# Patient Record
Sex: Male | Born: 2000 | Race: Black or African American | Hispanic: No | Marital: Single | Smoking: Never smoker
Health system: Western US, Academic
[De-identification: ages and names within clinical notes are randomized; demographics above are authoritative.]

## PROBLEM LIST (undated history)

## (undated) HISTORY — PX: INCISION AND DRAINAGE DEEP NECK ABSCESS: SHX1797

## (undated) MED ORDER — NITROGLYCERIN 0.2 MG/HR TD PT24
1.00 | MEDICATED_PATCH | Freq: Every day | TRANSDERMAL | 0 refills | Status: AC
Start: 2018-10-09 — End: ?

## (undated) MED ORDER — FAMOTIDINE 20 MG OR TABS
ORAL_TABLET | ORAL | 0 refills | Status: AC
Start: 2020-04-25 — End: ?

---
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
## 2020-02-14 ENCOUNTER — Ambulatory Visit (INDEPENDENT_AMBULATORY_CARE_PROVIDER_SITE_OTHER): Admitting: Sports Medicine

## 2020-02-18 ENCOUNTER — Encounter (INDEPENDENT_AMBULATORY_CARE_PROVIDER_SITE_OTHER): Payer: Self-pay | Admitting: Sports Medicine

## 2020-02-18 ENCOUNTER — Ambulatory Visit (INDEPENDENT_AMBULATORY_CARE_PROVIDER_SITE_OTHER): Admitting: Sports Medicine

## 2020-02-18 VITALS — BP 110/70 | HR 88 | Temp 98.9°F | Wt 189.0 lb

## 2020-02-18 NOTE — Progress Notes (Signed)
 [facility] and [facility]   Primary Care Sports Medicine    Chief Complaint:   Chief Complaint   Patient presents with   . Shoulder Pain, Right     Pain x 2-3 years. No Specific Injury. Patient plays football.   . Shoulder Pain, Left     History of Present Illness:: Dennis Morales is a 20 year old man who presents for Left > Right shoulder pain and instability s/p football season.    Left shoulder is worse with quick, overhead shoulder movements   - reaching for a ball -> feeks loose    - feels kind of numbness/tingling, but not quite, "like a carrot that's about to snap"  - worse of left shoulder, but noticing on right shoulder as well  - relieved by rest  - unable to sleep on left side with arm extended  - feeling shoulder instability   - onset: 2-3 years, but worse this year  - AC seperation on right in 7th grade (~2015), fracture in left shoulder in 9th grade (~2017) -> glenoid rim  - LHD (writes with left), but throws with right   - [facility] in [place], [place]    - last office visit (12/25/2019) left lower extremity seroma aspiration no issues since  - signing 03/19/2020, spring ball is plan    - 04/17/2019 XR left shoulder:   "Xray: Small non-displaced posterior glenoid linear fracture. Physes open. No other abnormalities identified."    - 05/06/2014 HPI ("Glenoid fracture of shoulder, left, with routine healing, subsequent encounter"):  20 yo M here with dad for L shoulder injury. On 04/13/14 the pt was playing non-contact FB - WR. Pt states he was running and the DB was in his space - he pushed him off with both hands and felt his L shoulder "do something weird." Denies subluxation or dislocation but had immediate pain. Stopped playing and did cryotx afterwards. Initially had pain with abduction and flexion. No previous L shoulder injury. Pt is LHD but throws with the R.    At last OV there was question of inferior labral fracture. Pt did one session of PT and all pain  resolved and now has no restrictions in ROM or strength. Feels ready to RTP.     There is no problem list on file for this patient.    History reviewed. No pertinent past medical history.  Past Surgical History:   Procedure Laterality Date   . INCISION AND DRAINAGE DEEP NECK ABSCESS Right     2 yoa     Current Outpatient Medications on File Prior to Visit   Medication Sig Dispense Refill   . doxycycline (MONODOX) 100 MG capsule Take 1 capsule (100 mg) by mouth 2 times daily. 14 capsule 0   . nitroGLYcerin (NITRO-DUR) 0.2 MG/HR patch Apply 1 patch topically daily. 30 patch 0     No current facility-administered medications on file prior to visit.     Allergies   Allergen Reactions   . Polytrim [Polymyxin B-Trimethoprim] Hives     Family History   Problem Relation Name Age of Onset   . No Known Heart Disease Mother     . No Known Heart Disease Father         Review of systems:  Interim Constitutional, Musculoskeletal, Integument, Neurologic, Gastrointestinal, Genitourinary changes from baseline other than mentioned above: no.  General - no fevers, chills, unintentional wt loss   Resp - no cough, shortness of breath  Neurologic - no weakness, tingling, problems walking, fainting   Vascular - no leg cramps with walking, blue or pale hands/digits   GI- no GI bleeding/vomiting/hematemesis/hematochezia  Card - no chest pain, irregular heart beat   Muscle - no muscle swelling or focal weakness; see above  Skin - no rashes, open wounds   Blood - no bleeding, bruising   Endo - no night sweats, no heat intolerance    Physical Exam:  BP 110/70   Pulse 88   Temp 98.9 F (37.2 C)   Wt 85.7 kg (189 lb)   SpO2 99%   GENERAL: NAD, comfortable, WDWN  RESP:  Unlabored breathing  PSYCH: normal affect  NEURO:  alert & oriented  SKIN: Intact, no rashes or erythema, no open wounds  LYMPH:  No lymphadenopathy  MSK:  Bilateral SHOULDER:    INSPECTION:  Anterior - axillary fold, sternoclavicular joint and AC joint within normal limits    Lateral - Deltoid normal   Posterior - C-spine with normal lordosis, spine of scapula and inferior angle of scapula normal     PALPATION:   Superior  No tenderness AC joint  No tenderness clavicle  Lateral  No tenderness acromion, greater tubercle  Anterior  No tenderness bicipital groove/Anterior shoulder  No tenderness proximal humerus  Posterior - C-spine non-ttp. Spine of scapula and inferior scapular angle non-tenderness to palpation  + tenderness to palpation posterior GH space on left shoulder    ROM:   neck with FROM, no TTP  Full ROM abduction (endorses weaker/unstable to 120* to 180* on left shoulder)  Full ROM flexion (endorses weaker/unstable to 145* to 180* on left shoulder)  Full ROM external rotation (posterior pain on left shoulder at terminal range of motion)  Full ROM internal rotation     STRENGTH TESTING:  4+/5 supraspinatous/abduction bilaterally   4/5 external rotation left, 4+/5 right  5/5 internal rotation  5/5 triceps  5/5 biceps        NEUROVASCULAR:   Sensation intact in the C4-C8 distribution     No vascular compromise noted/cap refill intact    SPECIAL TESTS:    AC Joint Evaluation:  - Negative Cross-Arm Test    Biceps tendon Evaluation:  - Negative Speeds     Glenohumeral Joint Evaluation:  - Pos anterior shift on left  - Pos posterior shift on left  - increased translation of left shoulder with inferior pull   - pos ABER apprehension on left shoulder    Labrum Evaluation:  -Pos Crank and Clunk Tests (left)  - pos deep pain, weakness, and click with O'Brien's (Active compression test) on left    Impingement Evaluation:  - equivocal Neer's/Hawkins (crepitus on bilateral shoulders)    Rotator Cuff Evaluation:  -Negative External Rotation Lag  -Negative Internal Rotation Lag  -Negative Jobe test    IMAGING:  The following images were independently reviewed by myself and discussed with patient  Associated Order(s):  X-RAY EXAM SHOULDER 2+ VIEW   Operation:  X-RAY EXAM OF SHOULDER 2 VIEWS    Pre-Op Diagnosis:  Chronic pain of both shoulders, Instability of left shoulder joint   Post-Op Diagnosis:  None.      Procedure Note:    Bilateral shoulder XR 3 view: No acute fracture, dislocation, or malalignment. Evidence of inferior glenoid rim fracture (left) with cortical opacity at location. Ossicle/loose body superior to right coracoid noted and separation of AC joint space consistent with patient's history noted; otherwise the joint spaces are well  preserved. Soft tissues are unremarkable.                 ASSESSMENT AND PLAN:  1. Chronic pain of both shoulders  - X-Ray Shoulder 2+ View    2. Instability of left shoulder joint  3. Internal derangement of left shoulder  4. Subluxation of left shoulder joint, initial encounter  Given clinical instability of left shoulder, likely internal (labrum vs capsule) pathology. Patient does not endorse subluxation or dislocation event, but sport (football) with repeat trauma. Weakness in left shuolder compared to right more concerning for unilateral pathology rather than systemic laxity process.  - MRI Shoulder WO    5. Loose body in right shoulder  6. Closed dislocation of right acromioclavicular joint, sequela  XR with ?loose body vs ossicle superior to coracoid and evidence of remote AC sprain. May explain patient's R shuolder symptoms.       Patient Instructions   Please call and schedule your imaging (left shoulder MRI)  [facility]: [phone]  Locations: [email or web address]     Calling to schedule your study will trigger your insurance to review the request. If they say no, please let us know right away so we can appeal that decision.  If they say yes, please confirm your financial responsibility before getting the study done.       Follow up: Return if symptoms worsen or fail to improve, for discussion of shuolder MRI. consider further (ultrasound) evaluation of right shouler (coracoid loose body vs ossicle?) at  next office visit.    Medication Management   Medications reviewed with patient and medication list reconciled.   Over the counter medications, herbal therapies, and supplements reviewed.   Patient's understanding and response to medications assessed.   Barriers to medications assessed and addressed.   Risks, benefits, alternatives to medications reviewed.     Barriers to Learning   Assessed: none.  Patient verbalizes understanding of teaching and instructions.  Diagnosis and treatment options were discussed in detail with the patient who is in agreement with the plan. All questions were answered.  RTC/ED precautions discussed with patient.    I spent ~30 minutes of total time with this patient today, including face-to-face time and non-face-to-face activities on today's date, not including separately reportable services/procedures.      Garner Gavel, M.D.  [facility] & [facility]  Monday 02/18/2020, 5:34 PM

## 2020-02-18 NOTE — Patient Instructions (Signed)
 Please call and schedule your imaging (left shoulder MRI)  [facility]: [phone]  Locations: [email or web address]     Calling to schedule your study will trigger your insurance to review the request. If they say no, please let us know right away so we can appeal that decision.  If they say yes, please confirm your financial responsibility before getting the study done.

## 2020-02-18 NOTE — Procedures (Signed)
 Bilateral shoulder XR 3 view: No acute fracture, dislocation, or malalignment. Evidence of inferior glenoid rim fracture (left) with cortical opacity at location. Ossicle/loose body superior to right coracoid noted and separation of AC joint space consistent with patient's history noted; otherwise the joint spaces are well preserved. Soft tissues are unremarkable.

## 2020-02-20 ENCOUNTER — Encounter (INDEPENDENT_AMBULATORY_CARE_PROVIDER_SITE_OTHER): Payer: Self-pay | Admitting: Sports Medicine

## 2020-02-26 ENCOUNTER — Telehealth (INDEPENDENT_AMBULATORY_CARE_PROVIDER_SITE_OTHER): Payer: Self-pay | Admitting: Sports Medicine

## 2020-02-26 NOTE — Telephone Encounter (Signed)
 Mom called very upset stating [facility] called to inform her pt will need to reschedule his MRI of his shoulder because the clinical notes were not sent over to insurance for authorization. Mom stated [facility] spoke with [facility] and she stated she was going to send a message to [facility] and Dr. Jace Martinet. Mom states now she wil need to pay out of pocket so that the patient can get the MRI done because he needs this completed in order to keep his scholarship for college. Mom is very "pissed off and ready to cancel everything with [facility] because something was not done". Mom advised Melissa will give [facility] a call directly

## 2020-02-26 NOTE — Telephone Encounter (Signed)
 Spoke with Mylinda Asa at [facility] and the MRI has been approved. I called the patients mother and informed her that the MRI has been approved for tomorrow.MA

## 2020-02-28 ENCOUNTER — Encounter (INDEPENDENT_AMBULATORY_CARE_PROVIDER_SITE_OTHER): Payer: Self-pay | Admitting: Sports Medicine

## 2020-02-28 ENCOUNTER — Telehealth: Admitting: Sports Medicine

## 2020-02-28 NOTE — Patient Instructions (Addendum)
 [facility]; Drs. Clarise Crooks (plus resident surgeons): phone: [phone]).    Please call [phone]) to schedule an appointment with an orthopedic sports medicine provider.    [address]  [facility] (lower level)  [place], [place]  [address]  Fax:  [phone]    Other orthopedic surgeons in the [facility] area:  1) Dr. Nolene Baumgarten: phone [phone]. [email or web address]  2 Dr. Lorean Rodes: phone [phone]. [email or web address]  3) [facility]: phone [phone].  [email or web address]

## 2020-02-28 NOTE — Progress Notes (Signed)
 ---------------------(data below generated by Luanna Cole, MD)--------------------    Patient Verification & Telemedicine Consent:    I am proceeding with this evaluation at the direct request of the patient.  I have verified this is the correct patient and have obtained verbal consent from the patient/ surrogate to perform this voluntary telemedicine evaluation (including obtaining history, performing examination and reviewing data provided by the patient).   The patient/ surrogate has the right to refuse this evaluation.  I have explained risks (including potential loss of confidentiality), benefits, alternatives, and the potential need for subsequent face to face care. Patient/ surrogate understands that there is a risk of medical inaccuracies given that our recommendations will be made based on reported data (and we must therefore assume this information is accurate).  Knowing that there is a risk that this information is not reported accurately, and that the telemedicine video, audio, or data feed may be incomplete, the patient agrees to proceed with evaluation and holds [place] harmless knowing these risks. In this evaluation, we will be providing recommendations only.  The ultimate decision to follow, or not follow, these recommendations will be left to the bedside treating/ requesting practitioner.  The patient/ surrogate has been notified that other healthcare professionals (including students, residents and Engineer, maintenance) may be involved in this audio-video evaluation.   All laws concerning confidentiality and patient access to medical records and copies of medical records apply to telemedicine. The patient/ surrogate has received the MyChart Notice of Privacy Practices.  I have reviewed this above verification and consent paragraph with the patient/ surrogate.  If the patient is not capacitated to understand the above, and no surrogate is available, since this is not an emergency evaluation,  the visit will be rescheduled until such time that the patient can consent, or the surrogate is available to consent.    Demographics:   Medical Record #: [id number]   Date: February 28, 2020   Patient Name: Lionardo Haze   DOB: 2000/11/01  Age: 20 year old  Sex: male  Location: Home address on file    Evaluator(s):   Malikah Lakey was evaluated by me today.    Clinic Location:  [facility] [place]  [facility] [place]  [address]  [address]  [place] [address]  [phone]    Telehealth Method: office phone call     Justification for Telehealth Service:  Due to the COVID-19 Pandemic and a declared state of public health emergency, this service is being conducted via Video visit.     [facility] & [facility] TeleVisit    SUBJECTIVE  Neri Samek is a 20 year old man who presents for   Chief Complaint   Patient presents with   . Results     L shoulder MRI review   Mother, Lenis Dickinson, on the call.    - discussion of L shoulder MRI  - moving down to [facility]  - plans to sign with college in February 2022 and start spring practice (football)    Review of systems:    See HPI. Otherwise, patient did not endorse other symptoms.     Medications:    Current Outpatient Medications on File Prior to Visit   Medication Sig Dispense Refill   . doxycycline (MONODOX) 100 MG capsule Take 1 capsule (100 mg) by mouth 2 times daily. 14 capsule 0   . nitroGLYcerin (NITRO-DUR) 0.2 MG/HR patch  Apply 1 patch topically daily. 30 patch 0     No current facility-administered medications on file prior to visit.       Medical History:    Patient Active Problem List   Diagnosis   . Eczema   . Injury of shoulder, left, initial encounter       Surgical History:    Past Surgical History:   Procedure Laterality Date   . INCISION AND DRAINAGE DEEP NECK ABSCESS Right     2 yoa       Social History:    Social History     Socioeconomic History   . Marital  status: Single     Spouse name: Not on file   . Number of children: Not on file   . Years of education: Not on file   . Highest education level: Not on file   Occupational History   . Not on file   Tobacco Use   . Smoking status: Never Smoker   . Smokeless tobacco: Never Used   Substance and Sexual Activity   . Alcohol use: No   . Drug use: No   . Sexual activity: Never   Other Topics Concern   . Military Service Not Asked   . Blood Transfusions Not Asked   . Caffeine Concern No   . Occupational Exposure Not Asked   . Hobby Hazards Not Asked   . Sleep Concern No     Comment: 5-6 hrs/night   . Stress Concern No     Comment: None   . Weight Concern Not Asked   . Special Diet No     Comment: Regular   . Back Care Not Asked   . Exercises Regularly Yes     Comment: Football, weight training x4-5 days/wk   . Bike Helmet Use Not Asked   . Seat Belt Use Not Asked   . Performs Self-Exams Not Asked   Social History Narrative    02/2019: school in [place] Plays football, running back.     Social Determinants of Health     Financial Resource Strain: Not on file   Food Insecurity: Not on file   Transportation Needs: Not on file   Physical Activity: Not on file   Stress: Not on file   Social Connections: Not on file   Intimate Partner Violence: Not on file   Housing Stability: Not on file       OBJECTIVE  There were no vitals taken for this visit.    General Assessment:  alert, speaking in full sentences and intelligibly without signs of distress  Lungs/Cardiopulmonary:  speaking in full sentences, no audible wheezes/respiratory sounds, breathing is comfortable and normal rate      IMAGING: report reviewed:  02/27/2020 Left shoulder MRI conclusions:  1) evidence of a prior posterior dislocation with a mild reverse anterior Hill-Sachs deformity and a reverse osseous Bankart injury noted throughout the posterior glenoid/labrum. Consider CT of the left shoulder for better osseous evaluation of the first osseous Bankart.  2) mild chondral  heterogeneity along the posterior aspect of the glenoid.   3) small glenohumeral joint effusion.    ASSESSMENT/PLAN  Seab Axel is a 20 year old male presenting with:    1. 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 Anterior Hill Sachs deformity, left  3. History of fracture in left glenoid rim (2016)  4. Subluxation of left shoulder joint, subsequent encounter  5. Instability of left shoulder joint  Reviewed imaging with patient and patient's mother over the phone. Given increasing clinical instability and MRI findings, referred to ortho.  - Sports Medicine (Orthopedics)    Follow up: Return if symptoms worsen or fail to improve.     Patient Instructions   [facility]; Drs. Suella Broad (plus resident surgeons): phone: [phone]).    Please call [phone]) to schedule an appointment with an orthopedic sports medicine provider.    [address]  [facility] ([address])  [place], [place]  [address]  Fax:  [phone]    Other orthopedic surgeons in the [facility] area:  1) Dr. Carmina Miller: phone [phone]. [email or web address]  2 Dr. Harlon Ditty: phone [phone]. [email or web address]  3) [facility]: phone [phone].  [email or web address]             Patient Care Management:  Plan discussed with pt including risks/benefits/alternatives including watchful waiting.  Informed pt of 24/7 on call MD.  ED if acutely worsening after hours.  Pt verbalized understanding.    Medications reviewed with patient and medication list reconciled.  Over the counter medications, herbal therapies and supplements reviewed.  Patient's understanding and response to medications assessed.    Barriers to medications assessed and addressed.  Risks, benefits, alternatives to medications reviewed.  F/U:  See instructions    Garner Gavel, M.D.  [facility]  & [facility]  02/28/20, 4:17 PM

## 2020-03-11 ENCOUNTER — Encounter (INDEPENDENT_AMBULATORY_CARE_PROVIDER_SITE_OTHER): Payer: Self-pay | Admitting: Sports Medicine

## 2020-03-11 ENCOUNTER — Ambulatory Visit (INDEPENDENT_AMBULATORY_CARE_PROVIDER_SITE_OTHER): Payer: BC Managed Care – PPO | Admitting: Sports Medicine

## 2020-03-11 VITALS — BP 117/46 | HR 81 | Temp 98.8°F

## 2020-03-11 DIAGNOSIS — M25312 Other instability, left shoulder: Secondary | ICD-10-CM

## 2020-03-11 NOTE — Progress Notes (Signed)
[place] DEPARTMENT OF ORTHOPAEDIC SURGERY, SPORTS MEDICINE    PRIMARY CARE PROVIDER:   Brock Ra      REASON FOR VISIT:  Left shoulder injury    HISTORY: Dennis Morales is a 20 year old male  who presents today for evaluation of left shoulder pain.  The patient is left hand dominant.  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  The 1st injury occurred 2-3 years ago.  The injury occurred while playing football.  While playing running back and blocking the opposing player he sustained what he describes as a posterior dislocation of his left shoulder.  Since that injury he is noting increasing episodes of left shoulder instability.  He notes some instability on the right but it is not as significant as the left.  He is playing high-level football and plans to play division 1 football next year.  He was 1st seen by his primary care physician who proceeded with radiographs as well as an MRI.  He presents to clinic today to review these findings and to potentially discuss surgical management.    RELEVANT HISTORY AND RISK FACTORS: Negative    PHYSICAL EXAM:  VITALS: BP 117/46    Pulse 81    Temp 98.8 F (37.1 C)   GENERAL: A+Ox3. INAD. Well appearing and well groomed. Normal gait  MENTAL STATUS: Pleasant and cooperative. Alert and oriented x3 with normal mood and affect.  Vascular: Pulses are palpable 2+, capillary refills to digits are normal.  Skin: No wounds/lesions/ulcers. Skin warm & dry.  Respiratory: No respiratory distress    MUSCULOSKELETAL:    Shoulder: Left   Normal posture.  Shoulder: No swelling.  Range of Motion: Forward Flexion 180. External Rotation 60. IR L1. Contralateral shoulder: Full active and passive range of motion  Passive is symmetric to active.  Tenderness: No tenderness at the [facility] joint, biceps, anterior, posterior joint.  Strength: 5/5 Supraspinatus, 5/5 External Rotators, 5/5 Subscapularis  Biceps:  Negative Speeds.  Impingement Signs:  Normal.  Stability:  He has grossly positive posterior laxity on the left side.  He has 1+ posterior laxity on the right side.  Sensation intact to light touch in the bilateral upper extremities.  2+ radial pulses.    ==========    IMAGING FINDINGS: I independently reviewed and interpreted the patient's imaging today.  Xrays:  Radiographs that were reviewed within the patient's last clinic encounter with his primary care physician reveal good alignment and good bone density.  MRI:  The patient's noncontrast MRI that was performed at imaging healthcare specialist was reviewed today in clinic.  He has extensive tearing of the posterior labrum.  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  His articular cartilage is intact.  He has a small anterior reverse Hill-Sachs.  The anterior labrum is intact.  The rotator cuff is intact.    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  He is currently looking at colleges.  He has had instability in the left shoulder for the last 2-3 years.  He has been able to work around this but recognizes that this is an ongoing problem that is not going away despite conservative care.  We discussed the importance bone loss on the MRI scan.  I would  like to get a scan to further evaluate this as bone grafting could be considered in his case.  He is in a bit of a difficult position as he carries the ball with his right arm and uses the left arm for blocking.  I had some discussion the patient and his mother regarding with the surgery would look like and how long recovery would lost.  We are going to hold off on making final decisions until we have his CT scan.  He understands and all questions were answered.    -Patient was educated on clinical and imaging findings.  -Patient verbalizes understanding  of all discussions today, and all questions were answered satisfactorily.    I saw the patient, performed a physical exam, and developed the treatment plan.    I edited the note and agree with the content as written.    See PA/resident note for full detail.      ===========================    No past medical history on file.  Past Surgical History:   Procedure Laterality Date    INCISION AND DRAINAGE DEEP NECK ABSCESS Right     2 yoa     Current Outpatient Medications   Medication Sig    doxycycline (MONODOX) 100 MG capsule Take 1 capsule (100 mg) by mouth 2 times daily.    nitroGLYcerin (NITRO-DUR) 0.2 MG/HR patch Apply 1 patch topically daily.     No current facility-administered medications for this visit.      reports that he has never smoked. He has never used smokeless tobacco. He reports that he does not drink alcohol and does not use drugs.  family history includes No Known Heart Disease in his father and mother.  Allergies   Allergen Reactions    Polytrim [Polymyxin B-Trimethoprim] Hives

## 2020-03-11 NOTE — Patient Instructions (Signed)
Instructions For MRI or CT    Dr. Robertson has ordered a MRI or CT scan for you. Please call [facility] Scheduling at [phone] to schedule an appointment at your earliest convenience. Once you have scheduled the MRI/CT call the Sports Medicine office at [phone]) to schedule a follow up visit to review your results with Dr. Robertson. You do not have to wait until after the imaging has been done to schedule your follow up visit. It may take 2-3 business days for full results to be available after your imaging has been completed.     If you do not have your MRI/CT completed at [facility] you will need to bring a copy of the imaging and the report to the follow up visit.

## 2020-03-17 ENCOUNTER — Ambulatory Visit
Admission: RE | Admit: 2020-03-17 | Discharge: 2020-03-17 | Disposition: A | Discharge status: Home / Self Care | Payer: BC Managed Care – PPO | Attending: Sports Medicine | Admitting: Sports Medicine

## 2020-03-17 DIAGNOSIS — Z01818 Encounter for other preprocedural examination: Secondary | ICD-10-CM

## 2020-03-17 DIAGNOSIS — M25312 Other instability, left shoulder: Secondary | ICD-10-CM | POA: Insufficient documentation

## 2020-03-21 ENCOUNTER — Ambulatory Visit (INDEPENDENT_AMBULATORY_CARE_PROVIDER_SITE_OTHER): Payer: BC Managed Care – PPO | Admitting: Sports Medicine

## 2020-03-21 DIAGNOSIS — S4992XA Unspecified injury of left shoulder and upper arm, initial encounter: Secondary | ICD-10-CM

## 2020-03-21 NOTE — Patient Instructions (Signed)
Sports Medicine Surgery Scheduling with Dr. Catherine Robertson    Please call Jake Buland @ [phone] after a minimum of  3 business days to begin the surgery scheduling process. If your case is urgent, Dr. Robertson will communicate with the surgery coordinator to get your surgery scheduled as soon as possible.    Insurance: Scheduling your surgery generally requires authorization from your insurance (including PPOs). This may take 10 business days and in some cases longer. If you have PPO insurance plan and would like to have an estimated amount of the out of pocket expenses, please call [phone].    Preoperative and 1st postoperative appointments:  Appointments will be scheduled for you as part of your surgery scheduling process.    Disability Paperwork:   Please leave your disability request form filled out at the front desk, and pay the $10 fee for processing. Please note, we fulfill all SDI claims online after your date of surgery. You must go to [email or web address], and begin your disability claim. Once this portion of your claim is finished, please call [phone] to advise Dr. Robertson's assistant. The call center will ensure that her assistant receives your message. Please provide the call center with your SDI claim receipt number ("R Number") and your expected return to work date. Allow 7-10 days for our office to process the online forms.    Post-operative physical therapy: When you have a surgery date, please call to schedule your first post-operative physical therapy appointment. Most patients will see a therapist within the first week after surgery. This visit needs to be scheduled three weeks prior to surgery or as soon as possible. You are welcome to go to physical therapy at the location of your choice. If you are unsure where you want to go please call your insurance company for a list of providers you are authorized to see for physical therapy.     Paperwork: If you need work/school-related  paperwork or a DMV placard for after surgery, please contact the office @ [phone] prior to surgery.    Imaging: If you did NOT have your x-rays or MRI done at [facility], please bring your CD and radiology report on the day of surgery.    Medical Clearance: If you have been told that you will require medical clearance prior to surgery, please arrange a visit with your primary care physician for pre-operative medical clearance and have any records or medical clearance documentation faxed to the surgery scheduler. Once you have been formally cleared and all tests results have been received we can provide you with a date for surgery.

## 2020-03-24 ENCOUNTER — Encounter (INDEPENDENT_AMBULATORY_CARE_PROVIDER_SITE_OTHER): Payer: Self-pay | Admitting: Sports Medicine

## 2020-03-24 NOTE — Progress Notes (Signed)
[place] DEPARTMENT OF ORTHOPAEDIC SURGERY, SPORTS MEDICINE    PRIMARY CARE PROVIDER:   Brock Ra      REASON FOR VISIT:  Left shoulder injury    HISTORY: Dennis Morales is a 20 year old male  who presents today for evaluation of left shoulder pain.  The patient is left hand dominant.  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  The 1st injury occurred 2-3 years ago.  The injury occurred while playing football.  While playing running back and blocking the opposing player he sustained what he describes as a posterior dislocation of his left shoulder.  Since that injury he is noting increasing episodes of left shoulder instability.  He notes some instability on the right but it is not as significant as the left.  He is playing high-level football and plans to play division 1 football next year.  He was 1st seen by his primary care physician who proceeded with radiographs as well as an MRI.  He presents to clinic today to review these findings and to potentially discuss surgical management.    RELEVANT HISTORY AND RISK FACTORS: Negative    PHYSICAL EXAM:  VITALS: There were no vitals taken for this visit.  GENERAL: A+Ox3. INAD. Well appearing and well groomed. Normal gait  MENTAL STATUS: Pleasant and cooperative. Alert and oriented x3 with normal mood and affect.  Vascular: Pulses are palpable 2+, capillary refills to digits are normal.  Skin: No wounds/lesions/ulcers. Skin warm & dry.  Respiratory: No respiratory distress    MUSCULOSKELETAL:    Shoulder: Left   Normal posture.  Shoulder: No swelling.  Range of Motion: Forward Flexion 180. External Rotation 60. IR L1. Contralateral shoulder: Full active and passive range of motion  Passive is symmetric to active.  Tenderness: No tenderness at the [facility] joint, biceps, anterior, posterior joint.  Strength: 5/5 Supraspinatus, 5/5 External Rotators, 5/5 Subscapularis  Biceps:  Negative Speeds.  Impingement Signs:  Normal.  Stability:  He has grossly positive posterior laxity on the left side.  He has 1+ posterior laxity on the right side.  Sensation intact to light touch in the bilateral upper extremities.  2+ radial pulses.    ==========    IMAGING FINDINGS: I independently reviewed and interpreted the patient's imaging today.  Xrays:  Radiographs that were reviewed within the patient's last clinic encounter with his primary care physician reveal good alignment and good bone density.  MRI:  The patient's noncontrast MRI that was performed at imaging healthcare specialist was reviewed today in clinic.  He has extensive tearing of the posterior labrum.  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  His articular cartilage is intact.  He has a small anterior reverse Hill-Sachs.  The anterior labrum is intact.  The rotator cuff is intact.    CT of the shoulder shows posterior glenoid bone loss of 20%.  There is a medialized old fracture- possibly from the injury in 8th grade.    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  He is currently looking at colleges.  He has had instability in the left shoulder for the last 2-3 years.  He has been able to work around this but recognizes that this is an ongoing problem that is  not going away despite conservative care.      We went over the CT scan today with Dennis Morales and his mother.  We had a further discussion regarding operative versus nonoperative treatment. The patient  would like to proceed with operative treatment which would be Left shoulder arthroscopy with labral treatment and open bine graft placement to the posterior glenoid using iliac crest. We discussed the details of surgery as well as the rehabilitation today. We also discussed the risks of surgery including risk of anesthesia,  blood loss, infection, DVT, incomplete pain relief, no pain relief, stiffness, neurovascular injury, reinjury or retear, recurrent instability, need for hardware removal.. All questions were answered and surgery scheduling information was provided.      -Patient was educated on clinical and imaging findings.  -Patient verbalizes understanding of all discussions today, and all questions were answered satisfactorily.    ===========================    No past medical history on file.  Past Surgical History:   Procedure Laterality Date    INCISION AND DRAINAGE DEEP NECK ABSCESS Right     2 yoa     Current Outpatient Medications   Medication Sig    doxycycline (MONODOX) 100 MG capsule Take 1 capsule (100 mg) by mouth 2 times daily.    nitroGLYcerin (NITRO-DUR) 0.2 MG/HR patch Apply 1 patch topically daily.     No current facility-administered medications for this visit.      reports that he has never smoked. He has never used smokeless tobacco. He reports that he does not drink alcohol and does not use drugs.  family history includes No Known Heart Disease in his father and mother.  Allergies   Allergen Reactions    Polytrim [Polymyxin B-Trimethoprim] Hives

## 2020-03-25 ENCOUNTER — Encounter (INDEPENDENT_AMBULATORY_CARE_PROVIDER_SITE_OTHER): Payer: Self-pay

## 2020-03-25 ENCOUNTER — Encounter (INDEPENDENT_AMBULATORY_CARE_PROVIDER_SITE_OTHER): Payer: Self-pay | Admitting: Rehabilitative and Restorative Service Providers"

## 2020-03-25 DIAGNOSIS — Z1159 Encounter for screening for other viral diseases: Secondary | ICD-10-CM

## 2020-03-26 ENCOUNTER — Other Ambulatory Visit (INDEPENDENT_AMBULATORY_CARE_PROVIDER_SITE_OTHER): Payer: Self-pay | Admitting: Sports Medicine

## 2020-03-26 ENCOUNTER — Telehealth (HOSPITAL_BASED_OUTPATIENT_CLINIC_OR_DEPARTMENT_OTHER): Payer: Self-pay

## 2020-03-26 ENCOUNTER — Ambulatory Visit (INDEPENDENT_AMBULATORY_CARE_PROVIDER_SITE_OTHER): Payer: BC Managed Care – PPO | Admitting: Rehabilitative and Restorative Service Providers"

## 2020-03-26 ENCOUNTER — Encounter (INDEPENDENT_AMBULATORY_CARE_PROVIDER_SITE_OTHER): Payer: Self-pay | Admitting: Rehabilitative and Restorative Service Providers"

## 2020-03-26 ENCOUNTER — Encounter (HOSPITAL_COMMUNITY): Payer: Self-pay

## 2020-03-26 VITALS — BP 112/49 | HR 60 | Temp 97.6°F | Ht 68.0 in | Wt 192.0 lb

## 2020-03-26 DIAGNOSIS — M25312 Other instability, left shoulder: Secondary | ICD-10-CM

## 2020-03-26 DIAGNOSIS — G8918 Other acute postprocedural pain: Secondary | ICD-10-CM

## 2020-03-26 DIAGNOSIS — Z09 Encounter for follow-up examination after completed treatment for conditions other than malignant neoplasm: Secondary | ICD-10-CM

## 2020-03-26 MED ORDER — ONDANSETRON 4 MG OR TBDP
4.0000 mg | ORAL_TABLET | Freq: Three times a day (TID) | ORAL | 0 refills | Status: DC | PRN
Start: 2020-03-26 — End: 2020-04-04

## 2020-03-26 MED ORDER — NAPROXEN 500 MG OR TABS
500.0000 mg | ORAL_TABLET | Freq: Two times a day (BID) | ORAL | 0 refills | Status: AC
Start: 2020-03-26 — End: ?

## 2020-03-26 MED ORDER — OXYCODONE-ACETAMINOPHEN 5-325 MG OR TABS
1.0000 | ORAL_TABLET | ORAL | 0 refills | Status: DC | PRN
Start: 2020-03-26 — End: 2021-08-14

## 2020-03-26 MED ORDER — DOCUSATE SODIUM 250 MG OR CAPS
250.0000 mg | ORAL_CAPSULE | Freq: Two times a day (BID) | ORAL | 0 refills | Status: AC
Start: 2020-03-26 — End: ?

## 2020-03-26 NOTE — Telephone Encounter (Signed)
Called and spoke to pt mother- scheduled eval and follow ups for Sports med

## 2020-03-26 NOTE — Progress Notes (Signed)
Preoperative History and Physical      Primary Care Physician [name] R    Surgical Attending Physician:   Dr. Lamont Dowdy    CC: Left shoulder instability    HPI:     This is a 20 year old male who presents to clinic today for their pre op H&P. The patient is scheduled for:  Left shoulder arthroscopy with labral treatment open fixation of glenoid fracture with iliac crest bone graft versus allograft on (03/31/20)    Family/personal history anesthesia issues: no  Family/personal history of clotting or bleeding disorder: no  Personal history of DM: no      PT location:  Patient  [employer]) and [place] (when here in [place] post op-message sent to [facility] PT)    Patient Active Problem List    Diagnosis Date Noted    Eczema 02/28/2020    Injury of shoulder, left, initial encounter 02/28/2020       No past medical history on file.    Past Surgical History:   Procedure Laterality Date    INCISION AND DRAINAGE DEEP NECK ABSCESS Right     2 yoa       Current Outpatient Medications   Medication Sig Dispense Refill    docusate sodium (COLACE) 250 MG capsule Take 1 capsule (250 mg) by mouth 2 times daily. 20 capsule 0    naproxen (NAPROSYN) 500 MG tablet Take 1 tablet (500 mg) by mouth 2 times daily (with meals). 40 tablet 0    ondansetron (ZOFRAN ODT) 4 MG disintegrating tablet Take 1 tablet (4 mg) on or under the tongue every 8 hours as needed for Nausea/Vomiting. 10 tablet 0    oxyCODONE-acetaminophen (PERCOCET) 5-325 MG tablet Take 1 tablet by mouth every 4 hours as needed for Severe Pain (Pain Score 7-10). 40 tablet 0     No current facility-administered medications for this visit.       Allergies   Allergen Reactions    Polytrim [Polymyxin B-Trimethoprim] Hives         Family History   Problem Relation Name Age of Onset    No Known Heart Disease Mother      No Known Heart Disease Father         Social History     Socioeconomic History    Marital status: Single     Spouse name: Not on file    Number of  children: Not on file    Years of education: Not on file    Highest education level: Not on file   Occupational History    Not on file   Tobacco Use    Smoking status: Never Smoker    Smokeless tobacco: Never Used   Substance and Sexual Activity    Alcohol use: No    Drug use: No    Sexual activity: Never   Other Topics Concern    Military Service Not Asked    Blood Transfusions Not Asked    Caffeine Concern No    Occupational Exposure Not Asked    Hobby Hazards Not Asked    Sleep Concern No     Comment: 5-6 hrs/night    Stress Concern No     Comment: None    Weight Concern Not Asked    Special Diet No     Comment: Regular    Back Care Not Asked    Exercises Regularly Yes     Comment: Football, weight training x4-5 days/wk    Bike  Helmet Use Not Asked    Seat Belt Use Not Asked    Performs Self-Exams Not Asked   Social History Narrative    02/2019: school in [place] Plays football, running back.     Social Determinants of Health     Financial Resource Strain: Not on file   Food Insecurity: Not on file   Transportation Needs: Not on file   Physical Activity: Not on file   Stress: Not on file   Social Connections: Not on file   Intimate Partner Violence: Not on file   Housing Stability: Not on file     Review of Systems:  GENERAL: No recent cold or flu, no fever, no wt. loss  EYES: No acuity change, diplopia or blurry vision.  ENT: Denies hearing loss, nasal drainage, sore throat.  CV:  Denies chest pain, palpitations, PND, orthopnea.    RESP: Denies SOB, cough, wheezing, sputum   GI: Denies nausea, vomiting, diarrhea, constipation, melena, hematochezia,  or GERD/heartburn.  GU: Denies dysuria, urgency, hematuria,  frequency, nocturia.  MUSCULOSKELETAL:  Negative back pain, neck pain.  SKIN:  Denies lesions, rashes, masses.  NEURO: Denies syncope, dizziness, headaches, seizures, weakness, numbness.  PSYCH: Denies  depression, anxiety, suicidal.  ENDOCRINE: Denies  polyuria, polydipsia, thyroid  problems.  HEME/LYMPH:  Denies  bruising, bleeding, anemia.   MRSA-VRE:  Denies    Physical Exam:   Vitals: BP 112/49    Pulse 60    Temp 97.6 F (36.4 C) (Temporal)    Ht 5\' 8"  (1.727 m)    Wt 87.1 kg (192 lb)    BMI 29.19 kg/m     Physical Exam  General: WN WD NAD, A+Ox3, perrla, speech clear  Cardiovascular: S1, S2  RRR,  with no murmurs, rubs, or gallops  Pulmonary: No respiratory distress or labored breathing  GU: deferred    Musculoskeletal:   Shoulder: Left   Normal posture.  Shoulder: No swelling.  Range of Motion: Forward Flexion 180. External Rotation 60. IR L1. Contralateral shoulder: Full active and passive range of motion  Passive is symmetric to active.  Tenderness: No tenderness at the [facility] joint, biceps, anterior, posterior joint.  Strength: 5/5 Supraspinatus, 5/5 External Rotators, 5/5 Subscapularis  Biceps:  Negative Speeds.  Impingement Signs: Normal.  Stability:  He has grossly positive posterior laxity on the left side.  He has 1+ posterior laxity on the right side.  Sensation intact to light touch in the bilateral upper extremities.  2+ radial pulses.      Imaging:   Xrays:  Radiographs that were reviewed within the patient's last clinic encounter with his primary care physician reveal good alignment and good bone density.  MRI:  The patient's noncontrast MRI that was performed at imaging healthcare specialist was reviewed today in clinic.  He has extensive tearing of the posterior labrum.  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  His articular cartilage is intact.  He has a small anterior reverse Hill-Sachs.  The anterior labrum is intact.  The rotator cuff is intact.    Assessment/Plan  Preoperative history and physical exam completed    This is a 20 year old male  with a history of left shoulder instability scheduled for Left shoulder arthroscopy with labral treatment open  fixation of glenoid fracture with iliac crest bone graft versus allograft (03/31/20) with Dr. 04/02/20. Surgical consent  was signed today in clinic. The patient was prescribed their post op medication to include Percocet, Naprosyn, Docusate and Zofran.    We had a further discussion regarding operative versus nonoperative treatment. The patient would like to proceed with operative treatment. We discussed the details of surgery as well as the rehabilitation today. We also discussed the risks of surgery including risk of anesthesia, blood loss, infection, DVT, incomplete pain relief, no pain relief, stiffness, neurovascular injury, reinjury or retear. All questions were answered.    A CURES report was run today if the patient received narcotic pain medication for greater than 5 days. The medications prescribed today were reviewed with Dr. Merilynn Finland and are in line with our usual prescribing practices for this procedure.    Instructions  1. Do not drink or eat anything after midnight, unless otherwise specified.   2. Take your medications as directed by anesthesia.   3. Do not take herbal supplements for 7 days prior to surgery.   4. Stop ASA, NSAID's 7-10 days prior to procedure.    Code Status: Full Code/Full Care

## 2020-03-28 ENCOUNTER — Other Ambulatory Visit (INDEPENDENT_AMBULATORY_CARE_PROVIDER_SITE_OTHER): Payer: BC Managed Care – PPO | Attending: Acute Care

## 2020-03-28 DIAGNOSIS — Z20822 Contact with and (suspected) exposure to covid-19: Secondary | ICD-10-CM | POA: Insufficient documentation

## 2020-03-28 DIAGNOSIS — Z1159 Encounter for screening for other viral diseases: Secondary | ICD-10-CM | POA: Insufficient documentation

## 2020-03-28 LAB — COVID-19 CORONAVIRUS DETECTION ASSAY AT ~~LOC~~ LAB: COVID-19 Coronavirus Result: NOT DETECTED

## 2020-03-31 ENCOUNTER — Ambulatory Visit (HOSPITAL_BASED_OUTPATIENT_CLINIC_OR_DEPARTMENT_OTHER): Payer: BC Managed Care – PPO

## 2020-03-31 ENCOUNTER — Encounter (HOSPITAL_BASED_OUTPATIENT_CLINIC_OR_DEPARTMENT_OTHER): Admission: RE | Disposition: A | Discharge status: Home Health | Payer: Self-pay | Attending: Sports Medicine

## 2020-03-31 ENCOUNTER — Encounter (HOSPITAL_BASED_OUTPATIENT_CLINIC_OR_DEPARTMENT_OTHER): Payer: Self-pay | Admitting: Sports Medicine

## 2020-03-31 ENCOUNTER — Other Ambulatory Visit (HOSPITAL_BASED_OUTPATIENT_CLINIC_OR_DEPARTMENT_OTHER): Payer: Self-pay | Admitting: Sports Medicine

## 2020-03-31 ENCOUNTER — Ambulatory Visit (HOSPITAL_BASED_OUTPATIENT_CLINIC_OR_DEPARTMENT_OTHER): Payer: BC Managed Care – PPO | Admitting: Student in an Organized Health Care Education/Training Program

## 2020-03-31 ENCOUNTER — Encounter (HOSPITAL_COMMUNITY): Payer: Self-pay

## 2020-03-31 ENCOUNTER — Other Ambulatory Visit (HOSPITAL_COMMUNITY): Payer: Self-pay

## 2020-03-31 ENCOUNTER — Ambulatory Visit
Admission: RE | Admit: 2020-03-31 | Discharge: 2020-03-31 | Disposition: A | Discharge status: Home / Self Care | Payer: BC Managed Care – PPO | Attending: Sports Medicine | Admitting: Sports Medicine

## 2020-03-31 DIAGNOSIS — Z419 Encounter for procedure for purposes other than remedying health state, unspecified: Secondary | ICD-10-CM

## 2020-03-31 DIAGNOSIS — S42142A Displaced fracture of glenoid cavity of scapula, left shoulder, initial encounter for closed fracture: Secondary | ICD-10-CM

## 2020-03-31 DIAGNOSIS — M25312 Other instability, left shoulder: Secondary | ICD-10-CM

## 2020-03-31 DIAGNOSIS — S43432A Superior glenoid labrum lesion of left shoulder, initial encounter: Secondary | ICD-10-CM

## 2020-03-31 DIAGNOSIS — S4292XP Fracture of left shoulder girdle, part unspecified, subsequent encounter for fracture with malunion: Secondary | ICD-10-CM | POA: Insufficient documentation

## 2020-03-31 DIAGNOSIS — S4992XA Unspecified injury of left shoulder and upper arm, initial encounter: Secondary | ICD-10-CM | POA: Insufficient documentation

## 2020-03-31 DIAGNOSIS — G8918 Other acute postprocedural pain: Secondary | ICD-10-CM

## 2020-03-31 DIAGNOSIS — Z888 Allergy status to other drugs, medicaments and biological substances status: Secondary | ICD-10-CM | POA: Insufficient documentation

## 2020-03-31 DIAGNOSIS — S42142P Displaced fracture of glenoid cavity of scapula, left shoulder, subsequent encounter for fracture with malunion: Secondary | ICD-10-CM

## 2020-03-31 DIAGNOSIS — S43492A Other sprain of left shoulder joint, initial encounter: Secondary | ICD-10-CM | POA: Insufficient documentation

## 2020-03-31 SURGERY — ARTHROSCOPY, SHOULDER
Anesthesia: General | Site: Shoulder | Laterality: Left | Wound class: Class I (Clean)

## 2020-03-31 MED ORDER — DIPHENHYDRAMINE HCL 50 MG/ML IJ SOLN
12.5000 mg | Freq: Once | INTRAMUSCULAR | Status: DC | PRN
Start: 2020-03-31 — End: 2020-03-31

## 2020-03-31 MED ORDER — LIDOCAINE HCL (PF) 1 % IJ SOLN
0.1000 mL | Freq: Once | INTRAMUSCULAR | Status: DC | PRN
Start: 2020-03-31 — End: 2020-03-31

## 2020-03-31 MED ORDER — ROPIVACAINE HCL 5 MG/ML IJ SOLN
INTRAMUSCULAR | Status: DC | PRN
Start: 2020-03-31 — End: 2020-03-31
  Administered 2020-03-31: 12 mL via PERINEURAL

## 2020-03-31 MED ORDER — PROPOFOL 200 MG/20ML IV EMUL
INTRAVENOUS | Status: AC
Start: 2020-03-31 — End: ?
  Filled 2020-03-31: qty 20

## 2020-03-31 MED ORDER — OXYCODONE HCL 5 MG OR TABS
5.0000 mg | ORAL_TABLET | Freq: Once | ORAL | Status: DC | PRN
Start: 2020-03-31 — End: 2020-03-31

## 2020-03-31 MED ORDER — FENTANYL CITRATE (PF) 50 MCG/ML IJ SOLN (WRAPPED RECORD) ~~LOC~~
50.0000 ug | INTRAMUSCULAR | Status: DC | PRN
Start: 2020-03-31 — End: 2020-03-31

## 2020-03-31 MED ORDER — MIDAZOLAM HCL 2 MG/2ML IJ SOLN
INTRAMUSCULAR | Status: DC | PRN
Start: 2020-03-31 — End: 2020-03-31
  Administered 2020-03-31: 2 mg via INTRAVENOUS

## 2020-03-31 MED ORDER — DEXAMETHASONE SODIUM PHOSPHATE 20 MG/5ML IJ SOLN
INTRAMUSCULAR | Status: AC
Start: 2020-03-31 — End: ?
  Filled 2020-03-31: qty 5

## 2020-03-31 MED ORDER — MIDAZOLAM HCL 2 MG/2ML IJ SOLN
INTRAMUSCULAR | Status: AC
Start: 2020-03-31 — End: ?
  Filled 2020-03-31: qty 2

## 2020-03-31 MED ORDER — ONDANSETRON HCL 4 MG/2ML IV SOLN
INTRAMUSCULAR | Status: DC | PRN
Start: 2020-03-31 — End: 2020-03-31
  Administered 2020-03-31: 4 mg via INTRAVENOUS

## 2020-03-31 MED ORDER — KETOROLAC TROMETHAMINE 30 MG/ML IJ SOLN
INTRAMUSCULAR | Status: AC
Start: 2020-03-31 — End: ?
  Filled 2020-03-31: qty 1

## 2020-03-31 MED ORDER — HYDROMORPHONE HCL 1 MG/ML IJ SOLN
INTRAMUSCULAR | Status: DC | PRN
Start: 2020-03-31 — End: 2020-03-31
  Administered 2020-03-31 (×3): .5 mg via INTRAVENOUS

## 2020-03-31 MED ORDER — FENTANYL CITRATE (PF) 50 MCG/ML IJ SOLN (WRAPPED RECORD) ~~LOC~~
25.0000 ug | INTRAMUSCULAR | Status: DC | PRN
Start: 2020-03-31 — End: 2020-03-31

## 2020-03-31 MED ORDER — KETOROLAC TROMETHAMINE 30 MG/ML IJ SOLN
INTRAMUSCULAR | Status: DC | PRN
Start: 2020-03-31 — End: 2020-03-31
  Administered 2020-03-31 (×2): 15 mg via INTRAVENOUS

## 2020-03-31 MED ORDER — LIDOCAINE HCL (PF) 2 % IJ SOLN
INTRAMUSCULAR | Status: AC
Start: 2020-03-31 — End: ?
  Filled 2020-03-31: qty 5

## 2020-03-31 MED ORDER — DEXAMETHASONE SODIUM PHOSPHATE 4 MG/ML IJ SOLN (CUSTOM)
INTRAMUSCULAR | Status: DC | PRN
Start: 2020-03-31 — End: 2020-03-31
  Administered 2020-03-31: 6 mg via INTRAVENOUS

## 2020-03-31 MED ORDER — LIDOCAINE INFUSION FOR PAIN
INTRAVENOUS | Status: DC | PRN
Start: 2020-03-31 — End: 2020-03-31
  Administered 2020-03-31: 30 mg via INTRAVENOUS

## 2020-03-31 MED ORDER — ONDANSETRON HCL 4 MG/2ML IV SOLN
4.0000 mg | Freq: Once | INTRAMUSCULAR | Status: DC | PRN
Start: 2020-03-31 — End: 2020-03-31

## 2020-03-31 MED ORDER — HYDROMORPHONE HCL 1 MG/ML IJ SOLN
INTRAMUSCULAR | Status: AC
Start: 2020-03-31 — End: ?
  Filled 2020-03-31: qty 0.5

## 2020-03-31 MED ORDER — MEPERIDINE HCL 25 MG/ML IJ SOLN
12.5000 mg | INTRAMUSCULAR | Status: DC | PRN
Start: 2020-03-31 — End: 2020-03-31

## 2020-03-31 MED ORDER — FENTANYL CITRATE (PF) 100 MCG/2ML IJ SOLN
INTRAMUSCULAR | Status: AC
Start: 2020-03-31 — End: ?
  Filled 2020-03-31: qty 2

## 2020-03-31 MED ORDER — LACTATED RINGERS IV SOLN
INTRAVENOUS | Status: DC
Start: 2020-03-31 — End: 2020-03-31

## 2020-03-31 MED ORDER — SODIUM CHLORIDE 0.9 % IR SOLN
Status: DC | PRN
Start: 2020-03-31 — End: 2020-03-31

## 2020-03-31 MED ORDER — SODIUM CHLORIDE 0.9 % IV SOLN
12.5000 mg | Freq: Four times a day (QID) | INTRAVENOUS | Status: DC | PRN
Start: 2020-03-31 — End: 2020-03-31

## 2020-03-31 MED ORDER — NALOXONE HCL 0.4 MG/ML IJ SOLN
0.1000 mg | INTRAMUSCULAR | Status: DC | PRN
Start: 2020-03-31 — End: 2020-03-31

## 2020-03-31 MED ORDER — LACTATED RINGERS IV SOLN
INTRAVENOUS | Status: DC | PRN
Start: 2020-03-31 — End: 2020-03-31

## 2020-03-31 MED ORDER — PROPOFOL 200 MG/20ML IV EMUL
INTRAVENOUS | Status: DC | PRN
Start: 2020-03-31 — End: 2020-03-31
  Administered 2020-03-31: 200 mg via INTRAVENOUS

## 2020-03-31 MED ORDER — MENTHOL 3 MG MT LOZG
1.0000 | LOZENGE | OROMUCOSAL | Status: DC | PRN
Start: 2020-03-31 — End: 2020-03-31

## 2020-03-31 MED ORDER — BUPIVACAINE-EPINEPHRINE (PF) 0.25% -1:200000 IJ SOLN
INTRAMUSCULAR | Status: DC | PRN
Start: 2020-03-31 — End: 2020-03-31
  Administered 2020-03-31: 20 mL
  Administered 2020-03-31: 30 mL

## 2020-03-31 MED ORDER — ACETAMINOPHEN 325 MG PO TABS
975.0000 mg | ORAL_TABLET | Freq: Once | ORAL | Status: AC
Start: 2020-03-31 — End: 2020-03-31
  Administered 2020-03-31: 975 mg via ORAL
  Filled 2020-03-31: qty 3

## 2020-03-31 MED ORDER — CEFAZOLIN SODIUM 1 GM IJ SOLR
INTRAMUSCULAR | Status: DC | PRN
Start: 2020-03-31 — End: 2020-03-31
  Administered 2020-03-31 (×2): 2000 mg via INTRAVENOUS

## 2020-03-31 MED ORDER — CEFAZOLIN SODIUM 1 GM IJ SOLR
INTRAMUSCULAR | Status: AC
Start: 2020-03-31 — End: ?
  Filled 2020-03-31: qty 2000

## 2020-03-31 MED ORDER — HYDROMORPHONE HCL 1 MG/ML IJ SOLN
0.5000 mg | INTRAMUSCULAR | Status: DC | PRN
Start: 2020-03-31 — End: 2020-03-31

## 2020-03-31 MED ORDER — AMISULPRIDE (ANTIEMETIC) 10 MG/4ML IV SOLN
10.0000 mg | Freq: Once | INTRAVENOUS | Status: DC
Start: 2020-03-31 — End: 2020-03-31

## 2020-03-31 MED ORDER — FENTANYL CITRATE (PF) 250 MCG/5ML IJ SOLN
INTRAMUSCULAR | Status: DC | PRN
Start: 2020-03-31 — End: 2020-03-31
  Administered 2020-03-31 (×9): 50 ug via INTRAVENOUS

## 2020-03-31 MED ORDER — ONDANSETRON HCL 4 MG/2ML IV SOLN
INTRAMUSCULAR | Status: AC
Start: 2020-03-31 — End: ?
  Filled 2020-03-31: qty 2

## 2020-03-31 SURGICAL SUPPLY — 53 items
3M STERI-DRAPE 1000 (Drapes/towels) ×2 IMPLANT
ADAPTER DUAL SPIKE FOR SIMPULSE SUCTION/IRRIGATOR (Tubing/Suction) ×4
APPLICATOR CHLORAPREP 26ML, ~~LOC~~ (Prep Solutions) ×4
BLADE ARTHROSCOPY FORMULA 4.0MM RESECTOR (Knives/Blades) ×2
BLADE LRG MED AGRESS (Knives/Blades) ×2
BLADE SURGEON #15 STERILE (Knives/Blades) ×2 IMPLANT
BUR ROUND CUTTING 4.8MM (Drills/Bits/Burs/Taps/Reamers) ×2 IMPLANT
CANNULA ARTHROSCOPIC UNIVERSAL 5.5 X 70MM W/O FENETRATIONS (Lap/Endo/Arthroscopy) ×2
CANNULA TWIST IN 6 MM X 7 CM BX/5 (Needles/punch/cannula/biopsy) ×2
CAUTERY BOVIE ROCKER SWITCH PENCIL (Cautery) ×2
CAUTERY TIP EDGE BLADE ELECTRODE 2.5", HEX LOCKING (Cautery) ×2
DERMABOND ADVANCE 0.7ML (Suture) ×4
DRAPE ORTHO BAR SHEET 100 X 60" (Drapes/towels) ×2 IMPLANT
DRAPE U IMPERVIOUS SPLIT FILM, 54" X 76" (Drapes/towels) ×4
DRAPE X-RAY C ARM 41" X 74" (Drapes/towels) ×2
DRESSING TEGADERM HP 4X4 (Dressings/packing) ×10 IMPLANT
DRESSING XEROFORM STRL 1X8 (Dressings/packing) ×2
DRILL CANNULATED 2.75MM .066 (Drills/Bits/Burs/Taps/Reamers) ×2 IMPLANT
DRILL NON-CANNULATED 4MM (Drills/Bits/Burs/Taps/Reamers) ×2
GLOVE BIOGEL INDICATOR UNDERGLOVE SIZE 6.5 (Gloves/Gowns) ×2
GLOVE BIOGEL INDICATOR UNDERGLOVE SIZE 7 (Gloves/Gowns) ×2 IMPLANT
GLOVE BIOGEL PI INDICATOR SIZE 8 (Gloves/Gowns) ×2
GLOVE BIOGEL PI ULTRATOUCH SIZE 7.5 (Gloves/Gowns) ×2 IMPLANT
GLOVE SURGEON BIOGEL SIZE 7.5 (Gloves/Gowns) ×2 IMPLANT
GLOVE SURGICAL BIOGEL SIZE 6.5 (Gloves/Gowns) ×4
GOWN SIRUS XLG BLUE, AAMI LVL 4 (Gloves/Gowns) ×2
GUIDE WIRE .062 X 12"" LONG (Misc Medical Supply) ×4 IMPLANT
GUIDEWIRE ORTHOPEDIC W/TROCAR TIP .062" X 7" (Orthopedic guidewires) ×4
KIT JUGGERKNOT CURVED DISPOSABLE 1.5MM (Kits/Sets/Trays) ×2
KIT JUGGERKNOT STRAIGHT DISPOSABLE 1.5MM (Kits/Sets/Trays) IMPLANT
MAT SUCTION LOW PROFILE 50"" X 34"" (Misc Medical Supply) ×2
NEEDLE MAYO #4 0.05" X 1.26", 1/2 TAPER, CATGUT (Needles/punch/cannula/biopsy) ×2
SCREW CANNULATED FULLY THREADED 3.75MM X 38MM (Screws/anchors/cables) ×4 IMPLANT
SKIN PREP -BATH CLOTH CHG 2% (Prep Solutions) ×2 IMPLANT
SLING ULTRA III LG (Misc Medical Supply) ×2
SOFT ANCHOR JUGGERKNOT 1.5MM (Screws/anchors/cables) ×2 IMPLANT
SOLUTION IRR BAG .9% N/S 3000ML (Non-Pharmacy Meds/Solutions) ×4 IMPLANT
SOLUTION IRR POUR BTL 0.9% NS 1000ML (Non-Pharmacy Meds/Solutions) ×2
SOLUTION IRR POUR BTL H20 1000ML (Non-Pharmacy Meds/Solutions) ×2
SPONGE LAP RF DETECT 18" X 18" XRAY STERILE (Sponges) ×6
STRIP MEDI-STRIP SKIN CLOSURE 1/2 X 4" (Dressings/packing) ×1 IMPLANT
STRIP SKIN CLOSURE 1/2 X 4 (Dressings/packing) ×2
SUPPORT MCCONNELL ARM 12-401 (Misc Surgical Supply) ×2 IMPLANT
SURGICAL PACK SHOULDER ARTHRO SAME DAY (Procedure Packs/kits) ×2
SUTURE ETHIBOND EXCEL 1 30" CT-1 (Suture) ×2 IMPLANT
SUTURE MONOCRYL 2-0 27" PS-2 (D8786) (Suture) ×4
SUTURE MONOCRYL 4-0 18" PS-2 (Y496) (Suture) ×2 IMPLANT
SUTURE MONOCRYL PLUS 3-0 27" PS-2 (MCP427) (Suture) ×4
SUTURE VICRYL+ 0 27" CP-2 (VCP870) (Suture) ×6
SYRINGE IRRG 50CC BULB (Misc Medical Supply) ×2 IMPLANT
TOWELS OR BLUE 4-PACK STERILE, DISPOSABLE (Drapes/towels) ×2 IMPLANT
TUBE YANKAUER FLEXIBLE TIP (Tubing/Suction) ×2 IMPLANT
TUBING CROSSFLOW INFLOW CASSETTE (Tubing/Suction) ×2 IMPLANT

## 2020-03-31 NOTE — Plan of Care (Signed)
Problem: Promotion of Perioperative Health and Safety  Goal: Promotion of Health and Safety of the Perioperative Patient  Description: The patient remains safe, receives treatment appropriate to the surgical intervention and patient's physiological needs and is discharged or transferred to the appropriate level of care.    Information below is the current care plan.  Outcome: Progressing

## 2020-03-31 NOTE — Anesthesia Procedure Notes (Signed)
Regional Anesthesia Block Procedure Note  Date & Time: 03/31/2020 12:52 PM    Short Procedural Summary (Full description of each separate nerve block  below):  Procedure #1 -  Interscalene.    Laterality - Left.    Block type - Single injection(s).                          Medications Administrations:  Medications Administered at:  03/31/2020 12:52 PM    Indications  Procedure performed to address pain in the Shoulder.  Laterality Left  Patient seen and examined in the PreOp area.  This procedure was performed at the request of the referring physician for post operative pain control.         Universal protocol  Verbal consent obtained, written consent obtained, risks and benefits discussed, patient states understanding of the procedure being performed, the patient's understanding of the procedure matches consent given, procedure consent matches procedure scheduled, relevant documents present and verified, test results available and properly labeled, site marked, imaging studies available, required blood products, implants, devices, and special equipment available and Immediately prior to procedure a time out was called to verify the correct patient, procedure, equipment, support staff and site/side marked as required  patient  verbally with patient, arm band, provided demographic data and hospital-assigned identification number  H&P: H&P Reviewed  Vital signs monitored: Yes  Sterile skin prep: Choloraprep (Chorahexadine)  O2 administered: Face Mask    Patient Instructions        Block Procedure #1  Block procedure type: Interscalene  Procedure laterality: Left    Nerve block:  Single injection(s)    Needle gauge used to perform procedure: Touhy and 22g  Block technique: Ultrasound guided  Ultrasound guidance used to identify relevant anatomy.  Ultrasound guidance used to visualize local anesthetic spread around nerve(s).  Ultrasound guidance used to guide needle to targeted nerve and avoid vascular puncture.    Brief  medication summary  IV sedation as documented on MAR. See MAR for full medication details  Local anesthetic medication: Ropivacaine    Local anesthetic volume: 12 mL  Complications: None  Procedure #1 comments: I, Peyton Bottoms, MD, was present for the entire procedure.    Peyton Bottoms, MD  03/31/2020 2:48 PM  Patient seen and examined in pre-op area, discussed risks, benefits, and alternatives of block procedure including but not limited to bleeding/infection, vascular injury, nerve damage, local anesthetic toxicity, and injury to nearby structures.  More specifically, the risk of nerve injury/damage was discussed in detail as this is a well recognized complication of nerve blocks (as well as a complication of surgery itself i.e. surgical trauma, tourniquet use, positioning, postoperative movement/manipulation, etc.).  Patient elected to proceed with block after understanding and accepting risks. Answered all questions, informed consent obtained. Patient tolerated procedure well without complications.         Block Procedure #2    Block Procedure #3    Block Procedure #4    Block Procedure #5    Block Procedure #6

## 2020-03-31 NOTE — Plan of Care (Signed)
Problem: Promotion of Perioperative Health and Safety  Goal: Promotion of Health and Safety of the Perioperative Patient  Description: The patient remains safe, receives treatment appropriate to the surgical intervention and patient's physiological needs and is discharged or transferred to the appropriate level of care.    Information below is the current care plan.  03/31/2020 1652 by Desirae Mancusi, RN  Outcome: Progressing  Flowsheets (Taken 03/31/2020 1652)  Guidelines: PACU  Individualized Interventions/Recommendations #1: reposition and medicate for pain goal  03/31/2020 1652 by Dublin Cantero, RN  Outcome: Progressing

## 2020-03-31 NOTE — Anesthesia Postprocedure Evaluation (Signed)
Anesthesia Post Note    Patient: Dennis Morales    Procedure(s) Performed: Procedure(s):  Left shoulder arthroscopy with labral treatment, open fixation of posterior glenoid fracture with iliac crest bone autograft      Final anesthesia type: General    Patient location: PACU    Post anesthesia pain: adequate analgesia    Mental status: awake, alert  and oriented    Airway Patent: Yes    Last Vitals:   Vitals Value Taken Time   BP 111/46 03/31/20 1730   Temp 36.2 03/31/20 1731   Pulse 90 03/31/20 1731   Resp 14 03/31/20 1731   SpO2 99 % 03/31/20 1731   Vitals shown include unvalidated device data.     Post vital signs: stable    Hydration: adequate    N/V:no    Anesthetic complications: no       Plan of care per primary team.

## 2020-03-31 NOTE — Discharge Instructions (Signed)
Dennis Morales. Merilynn Finland, MD  Department of Orthopaedic Surgery, Sports Medicine  [facility], [place]:   [phone]  [place] [address] Office, [place]: [phone]    Post-Operative Instructions  Shoulder Arthroscopy- Labral Repair/Capsulorrhaphy, open posterior shoulder stabilization     BANDAGES: You may remove the outer dressing and gauze pads after 7-10 days. You may cover the sutures loosely with a Band-Aid. Keep your sutures clean and dry.   SHOWER: If you wish to shower, you can do so on post op day # 2-3. Keep surgical dressing in place while you shower. If the dressing gets moist then remove the dressing after the shower and dry the area well before placing Band-Aids on the incisions. For subsequent showers use a plastic bag or saran wrap to keep your shoulder dry. Your sutures will be removed approximately 10-14 days after surgery.   SLING: The sling is to be worn AT ALL TIMES for 4-6 weeks EXCEPT to shower, get dressed and when in Physical Therapy. Do not try to actively lift your arm- this can cause strain on your repair.  You CAN move your elbow, wrist, & hand.   ICE: Ice will be placed on your shoulder in the recovery room. Leave it on as much as possible for the first 3 days. After 3 days, use it for a minimum of 4 times a day for 30 minutes. The more you use it, the less swelling and inflammation you will have.  Do not place ice directly on your skin: it can cause burns.  ACTIVITY: Rest the day of surgery. You may place a pillow under your forearm for comfort. It may be easier to sleep sitting up for the first few days.   DRIVING: No driving is allowed while you are wearing the sling.   RETURNING TO WORK OR SCHOOL: You may return to work (sedentary) or school 5-7 days after surgery if pain is tolerable. Returning to heavy labor will be determined by Dr. Merilynn Finland.  POST OPERATIVE VISIT: You should already have a post-operative visit scheduled.  If not call the office after surgery  to make an appointment 7-10 days after surgery.    PHYSICAL THERAPY: We will arrange for you to see a physical therapist for rehabilitation.  We will not start therapy until after your first post op appointment.  You will receive instructions to give to the physical therapist.  If you develop a fever (101.5), redness or drainage from the surgical incision site, please call our office to arrange for an evaluation.  PRESCRIPTIONS: You will receive prescriptions for medication to be used after surgery: these may include a narcotic pain medication, an anti-inflammatory, and/or an anti-nausea medication.  Narcotic Pain Medication: Percocet or Vicodin  Generally used for 1-2 weeks after surgery  May cause constipation and/or nausea  Anti-inflammatory Medication: Naprosyn or Motrin/Ibuprofen  If you have any difficulty using anti-inflammatory medications or aspirin, or have a history of ulcer disease, do not use.  You may use over the counter ibuprofen (Advil) or Naprosyn (Aleve) instead of the prescription we give you.  Anti-nausea Medication: Compazine or Zofran  If you have nausea after surgery, call the office so that we can provide appropriate medication.      Every patient is different: If Dr. Merilynn Finland gives you individual instructions (even if they are different from this handout), please follow them.  Phone: [phone]  **If you have any questions, please feel free to call our office.**

## 2020-03-31 NOTE — Plan of Care (Signed)
Problem: Promotion of Perioperative Health and Safety  Goal: Promotion of Health and Safety of the Perioperative Patient  Description: The patient remains safe, receives treatment appropriate to the surgical intervention and patient's physiological needs and is discharged or transferred to the appropriate level of care.    Information below is the current care plan.  03/31/2020 1652 by Danella Maiers, RN  Outcome: Progressing  Flowsheets (Taken 03/31/2020 1652)  Guidelines: PACU  Individualized Interventions/Recommendations #1: reposition and medicate for pain goal  03/31/2020 1652 by Danella Maiers, RN  Outcome: Progressing

## 2020-03-31 NOTE — Anesthesia Preprocedure Evaluation (Signed)
ANESTHESIA PRE-OPERATIVE EVALUATION    Patient Information    Name: Dennis Morales    MRN: [id number]    DOB: [birth year]    Age: 20 year old    Sex: male  Procedure(s):  Left shoulder arthroscopy with labral treatment open fixation of glenoid fracture with iliac crest bone graft versus allograft      Pre-op Vitals:   BP 129/61 (BP Location: Right arm, BP Patient Position: Semi-Fowlers)    Pulse 65    Temp 36.6 C    Resp 17    Ht 5' 9"  (1.753 m)    Wt 88.5 kg (195 lb)    SpO2 97%    BMI 28.80 kg/m    BMI kg/m2: 28.8 kg/m2    Primary language spoken:  English    ROS/Medical History:      History of Present Illness: Left shoulder arthroscopy with labral treatment open fixation of glenoid fracture with iliac crest bone graft versus allograft (Left Shoulder)    General:  negative for General ROS   Cardiovascular:  negative cardio ROS     Anesthesia History:  negative anesthesia history ROS   Pulmonary:   negative pulmonary ROS     Neuro/Psych:    Hematology/Oncology:       GI/Hepatic:  negative GI/hepatic ROS Infectious Disease:     Renal:   Endocrine/Other:     Pregnancy History:   Pediatrics:         Pre Anesthesia Testing (PCC/CPC) notes/comments:                 Physical Exam    Airway:    Inter-inciser distance > 4 cm  Prognanth Able    Mallampati: I  Neck ROM: full  TM distance: > 6 cm  Short thick neck: No          Cardiovascular:  - cardiovascular exam normal         Pulmonary:  - pulmonary exam normal           Neuro/Neck/Skeletal/Skin:      Dental:      Abdominal:              Last  OSA (STOP BANG) Score:  No data recorded    Last OSA  (STOP) Score for   Has a physician diagnosed you with sleep apnea?: No  Do you use a CPAP at home?: No  Do you snore loudly (loud enough to be heard through a closed door)?: 0  Do you often feel tired, fatigued or sleepy during the day?: 0  Has anyone observed that you stop breathing while you are sleeping?: 0  Have you ever been treated for high blood pressure?: 0  OSA total  score (A score of 2 or more is high risk. Offer patient sleep study.): 0        Has a physician diagnosed you with sleep apnea?: No  Do you use a CPAP at home?: No  OSA total score (A score of 2 or more is high risk. Offer patient sleep study.): 0    No past medical history on file.  Past Surgical History:   Procedure Laterality Date    INCISION AND DRAINAGE DEEP NECK ABSCESS Right     2 yoa     Social History     Tobacco Use    Smoking status: Never Smoker    Smokeless tobacco: Never Used   Substance Use Topics    Alcohol use: No  Drug use: No     Alcohol Use: Not on file       Current Facility-Administered Medications   Medication Dose Route Frequency Provider Last Rate Last Admin    lactated ringers infusion   IntraVENOUS Continuous Benito Mccreedy, MD 30 mL/hr at 03/31/20 1233 New Bag at 03/31/20 1233    lidocaine (XYLOCAINE) 1% PF injection 0.1 mL  0.1 mL IntraDERMAL Once PRN Benito Mccreedy, MD         Facility-Administered Medications Ordered in Other Encounters   Medication Dose Route Frequency Provider Last Rate Last Admin    ceFAZolin (ANCEF) injection   IntraVENOUS Intra-Op PRN Marily Lente, MD   2,000 mg at 03/31/20 1400    dexAMETHasone (DECADRON) injection   IntraVENOUS Intra-Op PRN Marily Lente, MD   6 mg at 03/31/20 1400    fentaNYL injection   IntraVENOUS Intra-Op PRN Marily Lente, MD   50 mcg at 03/31/20 1355    ketOROLAC (TORADOL) injection   IntraVENOUS Intra-Op PRN Marily Lente, MD   15 mg at 03/31/20 1400    lactated ringers infusion   IntraVENOUS Intra-Op Continuous Marily Lente, MD   New Bag at 03/31/20 1349    lidocaine 2000 mg/500 mL infusion for pain (4 mg/mL)   IntraVENOUS Intra-Op PRN Marily Lente, MD   30 mg at 03/31/20 1355    ondansetron [facility]) injection   IntraVENOUS Intra-Op PRN Marily Lente, MD   4 mg at 03/31/20 1400    propofol (DIPRIVAN) injection (200 mg/20 mL)    IntraVENOUS Intra-Op PRN Marily Lente, MD   200 mg at 03/31/20 1355     Allergies   Allergen Reactions    Polytrim [Polymyxin B-Trimethoprim] Hives       Labs and Other Data  No results found for: NA, SODIUM, K, CL, BICARB, BUN, CREAT, GLU, Lostant  No results found for: AST, ALT, GGT, LDH, ALK, TP, ALB, TBILI, DBILI  No results found for: WBC, RBC, HGB, HCT, MCV, MCHC, RDW, PLT, PLCTEL, MPV, MPVH, SEG, LYMPHS, MONOS, EOS, BASOS  No results found for: INR, PTT  No results found for: ARTPH, ARTPO2, ARTPCO2    Anesthesia Plan:  Risks and Benefits of Anesthesia  I have personally performed an appropriate pre-anesthesia physical exam of the patient (including heart, lungs, and airway) prior to the anesthetic and reviewed the pertinent medical history, drug and allergy history, laboratory and imaging studies and consultations.   I have determined that the patient has had adequate assessment and testing.  I have validated the documentation of these elements of the patient exam and/or have made necessary changes to reflect my own observations during my pre-anesthesia exam.  Anesthetic techniques, invasive monitors, anesthetic drugs for induction, maintenance and post-operative analgesia, risks and alternatives have been explained to the patient and/or patient's representatives.    I have prescribed the anesthetic plan:         Planned anesthesia method: General         ASA 2 (Mild systemic disease)     Potential anesthesia problems identified and risks including but not limited to the following were discussed with patient and/or patient's representative: Adverse or allergic drug reaction and Dental injury or sore throat    No Beta Blocker Indicated: Patient not on beta blockers    Planned monitoring method: Routine monitoring    Informed Consent:  Anesthetic plan and risks discussed with Patient.

## 2020-03-31 NOTE — H&P (Addendum)
HISTORY & PHYSICAL - INTERVAL ASSESSMENT    **ONLY TO BE USED IN ADDITION TO A HISTORY & PHYSICAL**    Dennis Morales  [id number]    This interval assessment is required for History & Physical completed less than 30 days but more than 24 hours prior to the admission or surgery. A History & Physical completed more than 30 days prior to the admission or surgery must be repeated. See 2/9 clinic note     Current Medical Status:  Unchanged    Medications / Allergies:  Unchanged    Review of Systems:  Unchanged     03/31/20  1216   BP: 130/65   Pulse: 78   Resp: 16   Temp: 97.9 F (36.6 C)   SpO2: 100%         Physical Examination:  Unchanged    Laboratory or Clinical Data:  Unchanged    Modifications of Initial Care Plan:  Unchanged    Will proceed with Left shoulder arthroscopy with labral treatment open fixation of glenoid fracture with iliac crest bone graft versus allograft on (03/31/20)    Bari Edward, MD  Resident Physician, Orthopaedic Surgery

## 2020-03-31 NOTE — Plan of Care (Signed)
Problem: Promotion of Perioperative Health and Safety  Goal: Promotion of Health and Safety of the Perioperative Patient  Description: The patient remains safe, receives treatment appropriate to the surgical intervention and patient's physiological needs and is discharged or transferred to the appropriate level of care.    Information below is the current care plan.  03/31/2020 1904 by Danella Maiers, RN  Outcome: Discharged  03/31/2020 1652 by Danella Maiers, RN  Outcome: Progressing  Flowsheets (Taken 03/31/2020 1652)  Guidelines: PACU  Individualized Interventions/Recommendations #1: reposition and medicate for pain goal  03/31/2020 1652 by Danella Maiers, RN  Outcome: Progressing

## 2020-04-01 ENCOUNTER — Telehealth (INDEPENDENT_AMBULATORY_CARE_PROVIDER_SITE_OTHER): Payer: Self-pay | Admitting: Sports Medicine

## 2020-04-01 NOTE — Addendum Note (Signed)
Addendum  created 04/01/20 1228 by Toy Cookey, DO    Clinical Note Signed

## 2020-04-01 NOTE — Anesthesia Follow Up (Signed)
Regional Anesthesia Telephone Encounter Follow-Up Note  Peripheral Nerve Block    History of Present Illness:     Dennis Morales is a 20 year old male who is POD 1 status post left shoulder arthroscopy. The patient had a left interscalene peripheral nerve block placed for post-operative pain/intraoperative anesthesia.     Regarding the pain, patient reports pain score: Controlled.    Patient reports:  Nerve block site is clean and dry without erythema, induration or tenderness to palpation  Sensory block: absent  Motor block: absent    Assessment:  This is a 20 year old male with post-operative/acute pain who had good pain control with regional nerve block.    Plan:   Block resolved  Patient advised to call [phone] and page the regional anesthesia fellow/resident on-call with any questions or concerns.    Note Author: Marcelino Duster Anna Livers, DO

## 2020-04-01 NOTE — Telephone Encounter (Signed)
Pt's mom is requesting an order for an ice machine. Please assist

## 2020-04-02 ENCOUNTER — Telehealth (INDEPENDENT_AMBULATORY_CARE_PROVIDER_SITE_OTHER): Payer: Self-pay | Admitting: Sports Medicine

## 2020-04-02 ENCOUNTER — Telehealth (HOSPITAL_BASED_OUTPATIENT_CLINIC_OR_DEPARTMENT_OTHER): Payer: Self-pay

## 2020-04-02 DIAGNOSIS — K5900 Constipation, unspecified: Secondary | ICD-10-CM

## 2020-04-02 NOTE — Telephone Encounter (Signed)
Shera Shaul PA-C is aware and attempting to contact patient over the phone.  So far unsuccessful in connecting but she suggests they go to the ED asap.

## 2020-04-02 NOTE — Telephone Encounter (Signed)
Returned call to patient's mom. Mother states that patient is now burping a lot and has a lot of gas. Son has not had a bowel movement. [facility] not [facility] the [facility] and mother did know it was prescribed. Mother instructed to try colace for bonstipation, simethicone for gas, small amount of blood was most likely from the strain of throwing up.    Mother instructed to try something bland like white rice, then a zofran 15-20 min after, then a pain pill 30-60 min after.     Patient verbalized understanding and denies any further questions or concerns at this time.     Instructed to call back [facility] with any further questions/concerns at [phone].    Sent to Provider.

## 2020-04-02 NOTE — Telephone Encounter (Signed)
Called back patient's mother (Alaina)re: ice machine.    Informed patient's mother that we have ice machine's available in clinic which are not covered by insurance, and would cost either $159 or $375, depending on the model selected.    The patient's mother stated she might have access to an ice machine already, but would call the clinic back if she decides to get one through [place].    If the patient's mother calls back to get an ice machine through our clinic, please direct the call to the Kaylyn Layer. @ [facility].

## 2020-04-02 NOTE — Telephone Encounter (Signed)
Spoke to [employer]. States that patient is having blood in emesis. "I saw it in the toilet." Reports that patient is lightheaded and very dizzy. C/O esophageal pain. He has no hx of esophagitis or Duodenal ulcers. He is in bed now alert and oriented.     I have asked that they hold the Naprosyn and Percocet. I have spoken to [facility]. They will contact provider.

## 2020-04-02 NOTE — Telephone Encounter (Signed)
Leodis Binet PA-C is aware and attempting to contact patient over the phone.  So far unsuccessful in connecting but she suggests they go to the ED asap.

## 2020-04-02 NOTE — Telephone Encounter (Signed)
Dennis Morales 20 year old   ESTABLISHED PAT ONLY     Caller's Name, Relationship to patient:    [name]      Reason for call (including the symptoms/concerns patient has):   Pt is experiencing pain in his groin area and under pad were his forearm is resting. Pt is nauseas and has vomiting.     Current Pain Level (0 Lowest -10 highest) :   8/10        Provider Name:  Dr. Merilynn Finland      If  Patient had surgery: (Procedure Name) (DOS)  03/31/2020  Left shoulder arthroscopy with labral treatment, open fixation of posterior glenoid fracture with iliac crest bone autograft      LOV Date and Plan:03/31/20    Modifications of Initial Care Plan:  Unchanged    Will proceed with Left shoulder arthroscopy with labral treatment open fixation of glenoid fracture with iliac crest bone graft versus allografton (03/31/20)          Best time to call patient and/or Medical Care Facility, and best contact ph# (RN please document outbound call):    [phone]        Reminder:  Symptom calls should be responded to within 2 hours by RN. If you need a more urgent callback to patient, please use RN chat group and/or page. Use messaging guidelines for red-flag symptoms. Please verify that your patient has received callback with-in the requested timeframe.

## 2020-04-02 NOTE — Telephone Encounter (Signed)
Pt's mom requesting to speak with RN, pt is now throwing up blood.

## 2020-04-02 NOTE — Addendum Note (Signed)
Addendum  created 04/02/20 0621 by Delontae Lamm    Charge Capture section accepted, SmartForm saved

## 2020-04-02 NOTE — Telephone Encounter (Signed)
DOS 03/31/20: Dr Merilynn Finland  Left shoulder arthroscopy with labral treatment, open fixation of posterior glenoid fracture with iliac crest bone autograft      Mother and patient report:   He is "very nauseated and has had emesis through the night."   He has not been taking the Zofran. I have advised to take every 8 hors for the next 24 hours at least. He will begin small quantities of food only after 30 min. He is taking Electrolyte drink/water-sips only.     His pain evea is 6. He took Naprosyn without food at 5am.   I discussed once he has Zofran, small quantity of food, then take Percocet.   Percocet and Naprosyn should be taken with food or they may cause nausea.     He is asking for his ice machine which he thought was ordered yesterday through Dr Charlean Sanfilippo office.     He states his sling has a piece on it that is heavy on his chest. He has not removed the sling. Will ask ATC to call for sling adjustment. CMST WNL to his left hand.     Groin area is "sore and stinging where he had the bone grafts. "

## 2020-04-02 NOTE — Telephone Encounter (Signed)
Spoke with patients mother regarding symptoms at 1220. She reports he is no longer vomiting. He had small amount of blood previously. He is tolerating liquids well and urinating appropriately. He is complaining of sharp abdominal pain. He has not had a BM since surgery. He will take Colace. Strict ED precautions given.

## 2020-04-03 MED ORDER — MAGNESIUM CITRATE 1.745 GM/30ML OR SOLN
100.0000 mL | Freq: Every day | ORAL | 1 refills | Status: AC
Start: 2020-04-03 — End: ?

## 2020-04-03 NOTE — Telephone Encounter (Signed)
Spoke with patient.  He is doing better and is passing gas but no BM.  Wrote for mag citrate.  Will check in tomorrow.

## 2020-04-04 ENCOUNTER — Emergency Department (HOSPITAL_COMMUNITY): Payer: BC Managed Care – PPO

## 2020-04-04 ENCOUNTER — Emergency Department
Admission: EM | Admit: 2020-04-04 | Discharge: 2020-04-04 | Disposition: A | Discharge status: Home / Self Care | Payer: BC Managed Care – PPO | Attending: Emergency Medicine | Admitting: Emergency Medicine

## 2020-04-04 DIAGNOSIS — R200 Anesthesia of skin: Secondary | ICD-10-CM | POA: Insufficient documentation

## 2020-04-04 DIAGNOSIS — Z881 Allergy status to other antibiotic agents status: Secondary | ICD-10-CM | POA: Insufficient documentation

## 2020-04-04 DIAGNOSIS — R1013 Epigastric pain: Secondary | ICD-10-CM | POA: Insufficient documentation

## 2020-04-04 DIAGNOSIS — Z0000 Encounter for general adult medical examination without abnormal findings: Secondary | ICD-10-CM

## 2020-04-04 DIAGNOSIS — R202 Paresthesia of skin: Secondary | ICD-10-CM | POA: Insufficient documentation

## 2020-04-04 DIAGNOSIS — K5900 Constipation, unspecified: Secondary | ICD-10-CM | POA: Insufficient documentation

## 2020-04-04 DIAGNOSIS — R112 Nausea with vomiting, unspecified: Secondary | ICD-10-CM

## 2020-04-04 LAB — CBC WITH DIFF, BLOOD
ANC-Automated: 4.7 10*3/uL (ref 1.6–7.0)
Abs Basophils: 0 10*3/uL (ref <0.1)
Abs Eosinophils: 0 10*3/uL (ref 0.0–0.5)
Abs Lymphs: 1.3 10*3/uL (ref 0.8–3.1)
Abs Monos: 0.5 10*3/uL (ref 0.2–0.8)
Basophils: 0 %
Eosinophils: 0 %
Hct: 38.8 % — ABNORMAL LOW (ref 40.0–50.0)
Hgb: 13.5 gm/dL — ABNORMAL LOW (ref 13.7–17.5)
Lymphocytes: 20 %
MCH: 30 pg (ref 26.0–32.0)
MCHC: 34.8 g/dL (ref 32.0–36.0)
MCV: 86.2 um3 (ref 79.0–95.0)
MPV: 9.5 fL (ref 9.4–12.4)
Monocytes: 7 %
Plt Count: 214 10*3/uL (ref 140–370)
RBC: 4.5 10*6/uL — ABNORMAL LOW (ref 4.60–6.10)
RDW: 12.4 % (ref 12.0–14.0)
Segs: 72 %
WBC: 6.5 10*3/uL (ref 4.0–10.0)

## 2020-04-04 LAB — COMPREHENSIVE METABOLIC PANEL, BLOOD
ALT (SGPT): 78 U/L — ABNORMAL HIGH (ref 0–41)
AST (SGOT): 85 U/L — ABNORMAL HIGH (ref 0–40)
Albumin: 4.5 g/dL (ref 3.5–5.2)
Alkaline Phos: 73 U/L (ref 40–129)
Anion Gap: 13 mmol/L (ref 7–15)
BUN: 13 mg/dL (ref 6–20)
Bicarbonate: 28 mmol/L (ref 22–29)
Bilirubin, Tot: 0.6 mg/dL (ref <1.2)
Calcium: 9.6 mg/dL (ref 8.5–10.6)
Chloride: 100 mmol/L (ref 98–107)
Creatinine: 0.75 mg/dL (ref 0.67–1.17)
GFR: >60 mL/min
Glucose: 105 mg/dL — ABNORMAL HIGH (ref 70–99)
Potassium: 4.2 mmol/L (ref 3.5–5.1)
Sodium: 141 mmol/L (ref 136–145)
Total Protein: 7.8 g/dL (ref 6.0–8.0)

## 2020-04-04 LAB — HCV ANTIBODY WITH REFLEX QUANT: Hepatitis C Ab: NONREACTIVE

## 2020-04-04 LAB — ACETAMINOPHEN, BLOOD: Acetaminophen: <5 ug/mL — ABNORMAL LOW (ref 10–30)

## 2020-04-04 LAB — HIV 1/2 ANTIBODY & P24 ANTIGEN ASSAY, BLOOD: HIV 1/2 Antibody & P24 Antigen Assay: NONREACTIVE

## 2020-04-04 LAB — LIPASE, BLOOD: Lipase: 23 U/L (ref 13–60)

## 2020-04-04 MED ORDER — ALUM & MAG HYDROXIDE-SIMETH 200-200-20 MG/5ML OR SUSP
30.0000 mL | Freq: Once | ORAL | Status: AC
Start: 2020-04-04 — End: 2020-04-04
  Administered 2020-04-04: 30 mL via ORAL
  Filled 2020-04-04: qty 30

## 2020-04-04 MED ORDER — FAMOTIDINE 20 MG OR TABS
20.0000 mg | ORAL_TABLET | Freq: Two times a day (BID) | ORAL | 0 refills | Status: AC
Start: 2020-04-04 — End: ?

## 2020-04-04 MED ORDER — LIDOCAINE VISCOUS 2 % MT SOLN
10.0000 mL | Freq: Once | OROMUCOSAL | Status: AC
Start: 2020-04-04 — End: 2020-04-04
  Administered 2020-04-04: 10 mL via ORAL
  Filled 2020-04-04: qty 15

## 2020-04-04 MED ORDER — ONDANSETRON 4 MG OR TBDP
4.0000 mg | ORAL_TABLET | Freq: Three times a day (TID) | ORAL | 0 refills | Status: DC | PRN
Start: 2020-04-04 — End: 2021-08-14

## 2020-04-04 MED ORDER — SENNA 8.6 MG OR TABS
2.0000 | ORAL_TABLET | Freq: Once | ORAL | Status: AC
Start: 2020-04-04 — End: 2020-04-04
  Administered 2020-04-04 (×2): 17.2 mg via ORAL
  Filled 2020-04-04: qty 2

## 2020-04-04 MED ORDER — SENNA 8.6 MG OR TABS
8.6000 mg | ORAL_TABLET | Freq: Every day | ORAL | 0 refills | Status: AC
Start: 2020-04-04 — End: ?

## 2020-04-04 MED ORDER — POLYETHYLENE GLYCOL 3350 OR POWD
17.0000 g | Freq: Every day | ORAL | 0 refills | Status: DC
Start: 2020-04-04 — End: 2021-08-14

## 2020-04-04 MED ORDER — POLYETHYLENE GLYCOL 3350 OR PACK
17.0000 g | PACK | Freq: Once | ORAL | Status: AC
Start: 2020-04-04 — End: 2020-04-04
  Administered 2020-04-04: 17 g via ORAL
  Filled 2020-04-04: qty 1

## 2020-04-04 NOTE — ED MD Progress Note (Signed)
ATTENDING ATTESTATION NOTE    I confirmed key elements of the history and physical examination and discussed management with the resident,  Dr. Niel Hummer.   I reviewed the resident's note and agree with the documented findings and plan of care. The attestation note reflects any changes or corrections to the ED Provider Note as documented.    KEY ELEMENTS:  20 year old male labrum surgery on 2/14, no BM since 2/14.  Mild diffuse abdominal tenderness and multiple episodes of NBNB emesis. No fever, no chest pain, no pain at incision sites.  PE well appearing NAD.  Incision sites without e/o infection.  R/U/M fx intact L hand.  abd soft mild diffuse tenderness without focality    A/P diffuse abdominal discomfort and constipation (usually has few BM daily). No evidence of surgical pathology on exam.  XR without e/o obstruction.  Home wht miralax washout

## 2020-04-04 NOTE — ED Notes (Signed)
Pt taken to and  from xray

## 2020-04-04 NOTE — Telephone Encounter (Signed)
Patient's mother is calling back to advise the pharmacy is still waiting on Rx for magnesium citrate 1.745 GM/30ML SOLN       Please contact mom once completed.

## 2020-04-04 NOTE — ED Provider Notes (Signed)
Emergency Department Note  [place] electronic medical record reviewed for pertinent medical history.     Patient: Dennis Morales, MRN [id number], DOB 2000-05-23  Primary MD: Dennis Morales R    Chief Complaint   Patient presents with    Surgical Problem Re-evaluation     Surgery on left labrum on 2/14. Has not had bowel movement since 2/14. Patient with multiple episode of vomiting.  Patient also states bulge to left hip surgical site where a bone graft was taken. Rates pain 5/10    Vomiting    Constipation     HPI: Dennis Morales is a 20 year old male with pmh significant for left labrum repair on 03/31/2020 presents with constipation, epigastric pain, approximately 3-4 episodes of vomiting over the last 2-3 days.  Last bowel movement was 4 days ago.  Patient describes epigastric pain which briefly response to Tums over the last 2-3 days, describes as intermittently sharp or pressure-like.  He has tried taking Colace and magnesium for his constipation without relief.  In contrast to above triage note, patient denies pain to his left hip surgical site.  Denies any fevers, chills, left arm pain, numbness, or tingling, denies any chest pain, shortness of breath, palpitations.  Patient denies any diarrhea, has still been passing gas.    Review of Systems -- Pertinent positive ROS findings are provided in the above HPI, all other systems reviewed and are negative    Allergies:Polytrim [polymyxin b-trimethoprim]    Past Medical & Surgical History:No past medical history on file.  Past Surgical History:   Procedure Laterality Date    INCISION AND DRAINAGE DEEP NECK ABSCESS Right     2 yoa       Family History:  Family History   Problem Relation Name Age of Onset    No Known Heart Disease Mother      No Known Heart Disease Father         Social History:  Social History     Tobacco Use    Smoking status: Never Smoker    Smokeless tobacco: Never Used   Substance Use Topics    Alcohol use: No    Drug use: No     Home  Medications:  Prior to Admission Medications   Prescriptions Last Dose Informant Patient Reported? Taking?   docusate sodium (COLACE) 250 MG capsule   No No   Sig: Take 1 capsule (250 mg) by mouth 2 times daily.   magnesium citrate 1.745 GM/30ML SOLN   No No   Sig: Take 100 mL by mouth daily.   naproxen (NAPROSYN) 500 MG tablet   No No   Sig: Take 1 tablet (500 mg) by mouth 2 times daily (with meals).   ondansetron (ZOFRAN ODT) 4 MG disintegrating tablet   No No   Sig: Take 1 tablet (4 mg) on or under the tongue every 8 hours as needed for Nausea/Vomiting.   oxyCODONE-acetaminophen (PERCOCET) 5-325 MG tablet   No No   Sig: Take 1 tablet by mouth every 4 hours as needed for Severe Pain (Pain Score 7-10).      Facility-Administered Medications: None       Physical Exam -- Vital signs reviewed and noted below. Nursing notes reviewed.  Vitals:    04/04/20 1100 04/04/20 1655   BP: 132/70 117/67   BP Location:  Right arm   BP Patient Position:  Sitting   Pulse: 69 84   Resp: 16 16   Temp: 99.1 F (37.3  C) 97.9 F (36.6 C)   SpO2: 100% 98%   Weight: 86 kg (189 lb 9.5 oz)    Height: 5\' 9"  (1.753 m)      Constitutional: Patient is in NAD, non-toxic appearing, cooperative.  HENT: Normocephalic, atraumatic.  Neck: Supple. Trachea midline.  Cardiovascular: RRR, no JVD.  Pulmonary/Chest: No increased WOB, no respiratory distress, no stridor.  Abdominal:  Soft, nontender, Nondistended.  Negative Murphy sign.  Extr/MSK: Moves all extremities.  Left arm in sling.  5/5 grip strength to left upper extremity.  Sensation intact to bilateral upper extremities.  Neurological: Alert and oriented to person, place, and time. Normal speech. Normal muscle tone.  Skin: No rashes, lesions, or wounds noted.    Assessment, Medical Decision Making, & Plan  [name] is a 20 year old male with pmh as above who presents with constipation, epigastric pain, approximately 3-4 episodes of vomiting over the last 3 days.  Concerning for  postoperative ileus given patient had a left shoulder procedure 4 days ago.  Patient passing gas, lower suspicion for small-bowel obstruction.  Abdominal exam is benign.  Regarding his epigastric pain, may be secondary to mild gastritis given his brief response to Tums.  No right upper quadrant tenderness to suggest biliary etiology of his pain including cholecystitis, cholangitis.    Plan:  Labs, upright KUB, GI cocktail, MiraLax and senna.    *see ED course for assessment/plan and medical decision making*    ED Course/Workup Review:   Diagnostics:  Labs Reviewed   CBC WITH DIFF, BLOOD - Abnormal; Notable for the following components:       Result Value    RBC 4.50 (*)     Hgb 13.5 (*)     Hct 38.8 (*)     All other components within normal limits   COMPREHENSIVE METABOLIC PANEL, BLOOD - Abnormal; Notable for the following components:    Glucose 105 (*)     AST (SGOT) 85 (*)     ALT (SGPT) 78 (*)     All other components within normal limits   ACETAMINOPHEN, BLOOD - Abnormal; Notable for the following components:    Acetaminophen <5 (*)     All other components within normal limits   HIV 1/2 ANTIBODY & P24 ANTIGEN ASSAY, BLOOD   LIPASE, BLOOD   LAB ADD ON TEST   HCV ANTIBODY WITH REFLEX QUANT     X-Ray Abdomen Single View   Final Result   IMPRESSION:   No radiographic evidence of an acute abdominal process.      I have reviewed the images and agree with the resident's interpretation.          ED Course as of 04/05/20 0931   Darby Shadwick's Documentation   Fri Apr 04, 2020   1657 Acetaminophen(!): <5   1640 Spoke with Orthopedics resident, who recommend clinic follow-up.  They stated patient can be expected to have residual symptoms after a nerve block which he received the day of his procedure.   1634 X-ray negative per radiology read.   1625 Prior to discharge, the patient reported some numbness/tingling throughout his left hand, primarily in his fingertips.  Noted to have 5/5 strength throughout the hand in  all nerve distributions.  Will speak with orthopedic surgery regarding this.     1600 Patient had significant relief with the GI cocktail.   1227 Hgb(!): 13.5   1227 WBC: 6.5     Noted to have slight elevation in his transaminases.  Recommended repeat lab test in approximately 1 week.  Acetaminophen level is undetectable.  I relayed this information to the patient's orthopedic surgeon as well.      Patient passed PO, ambulated at their baseline. Vitals reviewed.     I reevaluated the patient. The patient was discharged home, status improved, with instructions regarding supportive care, medications, and appropriate follow up. I discussed indications for seeking immediate medical attention, and the patient voiced understanding.  Recommended PCP follow up for continued medical care.  The patient was discharged home in stable condition after all questions were answered.    Treatments:  Orders Placed This Encounter   Procedures    X-Ray Abdomen Single View    CBC w/ Diff Lavender    Comprehensive Metabolic Panel    HIV 1/2 Antibody & P24 Antigen Assay    Lipase, Blood Green Plasma Separator Tube    Lab Add On Test    Acetaminophen, Blood - See Instructions     Medications   polyethylene glycol (MIRALAX) packet 17 g (17 g Oral Given 04/04/20 1248)   senna (SENOKOT) tablet 17.2 mg (17.2 mg Oral Given 04/04/20 1241)   aluminum-magnesium-simethicone (MAG-AL PLUS) 200-200-20 MG/5ML suspension 30 mL (30 mL Oral Given 04/04/20 1241)   lidocaine (XYLOCAINE) 2 % viscous solution 10 mL (10 mL Oral Given 04/04/20 1241)         ICD-10-CM ICD-9-CM    1. Epigastric pain  R10.13 789.06 famotidine (PEPCID) 20 MG tablet   2. Non-intractable vomiting with nausea, unspecified vomiting type  R11.2 787.01 ondansetron (ZOFRAN ODT) 4 MG disintegrating tablet   3. Constipation, unspecified constipation type  K59.00 564.00 polyethylene glycol (GLYCOLAX) 17 GM/SCOOP powder      senna (SENOKOT) 8.6 MG tablet       Patient seen and discussed with ED  attending, Dr. Jeanie Sewer, MD.       Leeanne Mannan, MD  Resident  04/05/20 0932       Jeanie Sewer, MD  04/08/20 [phone]

## 2020-04-04 NOTE — Op Note (Signed)
DATE OF SERVICE:  03/31/2020    PREOPERATIVE DIAGNOSIS:  Left shoulder malunion of glenoid fracture,  left shoulder instability, left shoulder posterior labral tear.     POSTOPERATIVE DIAGNOSIS:  Left shoulder malunion of glenoid fracture,  left shoulder instability, left shoulder posterior labral tear.     PROCEDURE PERFORMED:    1. Left shoulder arthroscopy, diagnostic;   2. Left shoulder repair of glenoid non union with open posterior augmentation  of bone with iliac crest.   2. Left shoulder posterior labral repair and capsulorrhaphy.  3. Left iliac crest bone graft harvest.    SURGEON/STAFF:  Benito Mccreedy, MD    ASSISTANT:  None.    ANESTHESIA:  General.    BLOOD LOSS:  200 cc.    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  He has continued to have instability since that time.  This  has become worse over time and affected his ability to play football  and to do other activities.  I met with the patient and his mother on  several occasions to discuss the problem, which I discussed with them  was quite complicated given the degree of bone loss and the longevity  of the issue.  We obtained an MRI and CT scan.  After the workup, we  discussed operative and nonoperative treatment options, risks,  benefits, and alternatives.  He elected to proceed with operative  care, and consent was obtained.     DESCRIPTION OF PROCEDURE:  The patient was appropriately identified  and marked in the preoperative area.  He was then brought back to the  operating room, where general anesthesia was induced.  He was placed  in the beach chair position with the extremities carefully padded and  protected.  Next, we prepped and draped the left iliac crest as well  as the left shoulder and upper extremity in normal sterile fashion.  Prior to incision, time-out was done, and antibiotics were given.  We  began by exam under  anesthesia.  The patient had severe laxity  posteriorly of the left shoulder.  He had normal anterior stability.    Next, we marked all incisions.  We then made a posterior portal to  the glenohumeral joint.  We then triangulated to place an anterior  portal and cannula.  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  He had significant bone  loss of the posterior glenoid with partial obliteration of the  glenoid labrum.  The rotator cuff, biceps, superior labrum, and  anterior labrum were intact.  There were no loose bodies.  After this  initial arthroscopy, I did some minor debridement, but felt this  would be better addressed open.      Therefore, I removed my arthroscopic  instruments.  We then moved to the iliac crest.  We had previously  prepped and draped this area.  I made a 4 cm incision at the iliac  crest several centimeters posterior to the ASIS.  I dissected through  subcutaneous tissue, obtaining hemostasis as needed.  I then was able  to palpate the crest and used a Bovie electrocautery to split the  fascia and dissect this both superficial and deep.  Once this was  done, I used a Dispensing optician  to clear off the iliac crest of soft  tissue.  I then placed a deep wide Hohmann retractor to protect the  intrapelvic contents.  I then measured a 2.5 cm bone block with 1.5  cm depth.  I used an oscillating saw to cut my bone block.  I then  used an osteotome to free this up.  Once this was done, I was able to  remove this with a Kocher clamp.  I brought this to the back table.  I removed a small amount of additional muscle.  I then went back to  the iliac crest site.  I thoroughly irrigated and obtained  hemostasis.  I then closed the fascial layer using 0 Vicryl sutures,  the subcutaneous tissue with 2-0 Monocryl, and the skin with  3-0  Monocryl.  We placed local anesthetic.  We then placed Dermabond to  seal the skin and a sterile dressing.      Once this was done, I went to  the back table.  I did some shaping of my bone block to ensure that  it was an appropriate size.  I then set this aside.  I then moved to  the posterior shoulder.  I made a 7 cm incision from the arthroscopic  portal distally along the joint line and just medial to that.  I then  dissected through subcutaneous tissue.  I split the deltoid fascia.  I then carefully split the deltoid fibers using Metzenbaum scissors  as well as electrocautery.  I dissected down until I had encountered  the rotator cuff.  I then found the interval between the teres minor  and the infraspinatus.  This was cut in line with the raphe.  I then  used 2 [employer] retractors to pull these muscles superior and  inferior.  This gave me good exposure of the patient's capsule.  I  then made an incision through the capsule sharply using a 15 blade.  The incision was made at the level of the glenoid and just medial.  I  then brought this flap up and marked it with a #1 Ethibond.  Of note,  the patient had a very patulous and thinned out capsule, likely  related to his previous dislocations.  Once this was done, I was able  to place a Fukuda clamp within the joint.  I then placed a  double-pronged Hohmann along the glenoid neck.  I then used an  elevator to free along the glenoid neck.  I was able to see the  nonunion fragment, which was essentially a ridge on the medial  glenoid.  I then used a burr to remove this abnormal area of bone and  to create a good surface for placement of the bone graft.  I  continued to expose this area until I had appropriate exposure.  Once  this was done, I debrided a small amount of labrum.  Unfortunately,  there was really not much to repair given the degree of damage.  Next, I at the back table drilled two 4 mm drill holes through my  bone block.  I placed this on the  positioning clamp from [employer].  I  then brought this into the joint and pinned it into place.  I then  adjusted as necessary until I was very satisfied with my placement  both superior to anterior as well as medial to lateral.  I then  over-drilled my pins with a 2.7 mm drill bit.  I measured and  placed  two 38 mm 3.75 mm screws.  These were fully-threaded cannulated  screws from [employer].  This seated nicely with excellent bite.  The  patient had very good purchase on the bone.  Once this was in place,  I checked my stability, which was nicely satisfied.  I took final  images, showing that the screws were in appropriate placement, as was  the bone graft.      Once this was done, I thoroughly irrigated within  the joint.  Before closing the joint, I placed a JuggerKnot suture  anchor at the 8 o'clock position at the glenoid.  This allowed me to  sew down the posterior capsule.  I also used the sutures to over-tie  the previously placed Ethibond sutures in the capsule.  This gave me  a good capsular repair.  Once this was done, I irrigated again.  I  removed my deep retractors.  I closed the interval between the  infraspinatus and teres with 0 Vicryl as well as the deltoid fascia.  I then closed subcutaneous tissue with 2-0 Monocryl and skin with 3-0  Monocryl.  I placed Dermabond to seal the skin.  I injected local  anesthetic.  We placed a sterile dressing as well as an UltraSling.  The patient was then extubated uneventfully and taken to  postoperative care unit in stable condition.       Job #:  [id number]

## 2020-04-04 NOTE — Discharge Instructions (Addendum)
-   You were seen in the Emergency Department for abdominal pain, constipation.  - Your workup was notable for slightly elevated liver enzymes.  Please follow up with your primary care physician in approximately 1 week to have these rechecked.  - You were started on Miralax and Senna, please take daily.  No need to continue with Colace in the meantime.  - Pepcid can help with acid reflux/gastritis, you can take this in addition to Tums.  - You can also try Pepto-bismol for these symptoms.    - Return to the ED for any new or worsening symptoms.  - Please continue to follow up with your orthopedic surgeon as planned.  - Take all medications as directed.

## 2020-04-04 NOTE — Telephone Encounter (Addendum)
Pt's mom calling Dr.Robertson to informed will be taking pt to ED in [facility], pt continues vomiting with abdominal pain upper area.

## 2020-04-04 NOTE — ED Notes (Addendum)
Call received from lab, per tech acetaminophen level can't be added on, despite requisition and order placed at 13:38, new yellow tube needs to be drawn and new order needs to be placed. Dr. Niel Hummer made aware.

## 2020-04-04 NOTE — ED Notes (Signed)
Dr. Niel Hummer at bedside to discuss discharge.

## 2020-04-04 NOTE — ED Notes (Signed)
Dr. Anshus at bedside.

## 2020-04-04 NOTE — ED Notes (Addendum)
Pt aaox4, stable for discharge. Dressings changed to left shoulder per pt request, clean dry and intact. Instructions, scripts sent to pharmacy, address verified with pt and ortho follow up given. Pt verbalized understanding, all questions answered. Pt ambulate out steady gait noted, accompanied by mother.

## 2020-04-04 NOTE — Telephone Encounter (Signed)
Spoke to mother. She is taking patient to the ED at [facility]. Patient continues with nausea, emesis. She will f/u with [place] once the patient has been assessed.

## 2020-04-04 NOTE — ED Notes (Signed)
Dr Ishimine at bedside

## 2020-04-08 ENCOUNTER — Other Ambulatory Visit (INDEPENDENT_AMBULATORY_CARE_PROVIDER_SITE_OTHER): Payer: Self-pay

## 2020-04-10 DIAGNOSIS — S4992XA Unspecified injury of left shoulder and upper arm, initial encounter: Secondary | ICD-10-CM | POA: Insufficient documentation

## 2020-04-14 ENCOUNTER — Ambulatory Visit (INDEPENDENT_AMBULATORY_CARE_PROVIDER_SITE_OTHER): Payer: BC Managed Care – PPO | Admitting: Sports Medicine

## 2020-04-14 ENCOUNTER — Encounter (INDEPENDENT_AMBULATORY_CARE_PROVIDER_SITE_OTHER): Payer: Self-pay | Admitting: Sports Medicine

## 2020-04-14 ENCOUNTER — Encounter (INDEPENDENT_AMBULATORY_CARE_PROVIDER_SITE_OTHER): Payer: BC Managed Care – PPO | Admitting: Rehabilitative and Restorative Service Providers"

## 2020-04-14 DIAGNOSIS — Z09 Encounter for follow-up examination after completed treatment for conditions other than malignant neoplasm: Secondary | ICD-10-CM

## 2020-04-14 DIAGNOSIS — S4992XA Unspecified injury of left shoulder and upper arm, initial encounter: Secondary | ICD-10-CM

## 2020-04-14 NOTE — Progress Notes (Signed)
[facility], [facility]IC[facility] FOLLOWUP    PRIMARY CARE PROVIDER:   Brock Ra    PROCEDURE:  1. Left shoulder arthroscopy, diagnostic;   2. Left shoulder repair of glenoid non union with open posterior augmentation  of bone with iliac crest.   2. Left shoulder posterior labral repair and capsulorrhaphy.  3. Left iliac crest bone graft harvest.    DATE OF SURGERY: 03/31/2020     HISTORY: Dennis Morales is a 20 year old male  who presents today for post operative evaluation of the shoulder.  He is now 2 weeks postop.  He is doing well overall with minimal pain.  Got quite a bit of postoperative constipation which is not resolved.  He is starting physical therapy tomorrow.      Past medical history, medications, allergies, review of systems are unchanged since the previous visit.    PHYSICAL EXAM:  VITALS: There were no vitals taken for this visit., There is no height or weight on file to calculate BMI.,  GENERAL: A+Ox3. INAD. Well appearing and well groomed. Normal gait.  MENTAL STATUS: Pleasant and cooperative. Alert and oriented x3 with normal mood and affect.  Vascular: Pulses are palpable 2+, capillary refills to digits are normal.  Skin: No wounds/lesions/ulcers. Skin warm & dry.  Respiratory: No respiratory distress.    UPPER EXTREMITY:   Normal posture.  Shoulder: No swelling.  Incisions are healing well.  Range of Motion:  He is able to do a pendulum difficulty.  He has full range of motion elbow.  Sensation intact to light touch in the bilateral upper extremities.  2+ radial pulses.    ==========    ASSESSMENT & PLAN: Dennis Morales is a 20 year old male status post left shoulder posterior stabilization with iliac crest bone grafting.  He is doing well at his 1st postoperative visit.  He will continue the sling the next weeks.  He is cleared to start physical therapy.  His protocol was provided today.  We discussed his restrictions detail.  All questions were answered.  Recheck in 4  weeks.      -Patient verbalizes understanding of all discussions today, and all questions were answered satisfactorily.

## 2020-04-14 NOTE — Progress Notes (Signed)
Evalee Mutton. Merilynn Finland, MD  Department of Orthopaedic Surgery, Sports Medicine  [facility], [place]: [phone]    Patient Name: Dennis Morales  Date:  03/31/2020    PHYSICAL THERAPY PRESCRIPTION:  LEFT SHOULDER POSTERIOR  ARTHROSCOPIC STABILIZATION WITH ILIAC CREST BONE GRAFT (L)    RECOVERY / RECUPERATION PHASE:  WEEKS 1 - 4   * Immobilization in sling x 4 weeks except for exercises   * Elbow A/AAROM: flexion and extension.  * Protect POSTERIOR capsule from stretch. Limit IR to neutral, Horiz ABD, to scapular plane.   * Modalities (i.e. CryoCuff) PRN.   * Wrist and gripping exercises.  * Deltoid isometrics.   * Grip strengthening   *Light soft tissue for L iliac crest and shoulder.  Gentle hip and core program  WEEKS 4 - 6   * At 4 weeks PROM: pulley for flexion, pendulum exercises.   * Pool exercises: A/AAROM flexion, extension, horiz. addctn, elbow flex & extension.   * Deltoid isometrics.   * Lightly resisted elbow flexion.   *Scapular and cuff isometrics   * Continue with wrist ex.   * Modalities PRN.   * Discontinue sling at 6 weeks; may be part time at 4 weeks  WEEKS 6 - 12   * 6-10 weeks, gradual A/AA/PROM to improve ER with arm at side.   * Progress flexion to tolerance.   * 10-12 weeks, A/AA/PROM to improve IR/ER   * Pool exercises AROM all directions below horizontal, light resisted motions in all planes.   * AROM activities to restore flexion, IR, ER, horiz ADD.   * Deltoid, Rotator Cuff isometrics progressing to isotonics.   * PREs for scapular muscles, latissimus, biceps, triceps.   * PREs work rotators in isolation (use modified neutral).   * Joint mobilization (posterior glides).   * Emphasize posterior cuff, latissimus, & scapular muscle strengthen, stress eccentrics.   * Utilize exercise arcs that protect anterior capsule from stress during PREs.   * Keep all strength exercises below the horizontal plane in this phase.  WEEKS 12 - 16   * AROM activities to restore full  ROM.   * Restore scapulohumeral rhythm.   * Joint mobilization.   * Aggressive scapular stabilization and eccentric strengthening program.   * PREs for all upper quarter musculature (begin to integrate upper extremity   patterns). Continue to emphasize eccentrics and glenohumeral stabilization. All PREs  are  below the horizontal plane for non-throwers.   * Begin isokinetics.  WEEKS 16 - 24   * Begin muscle endurance activities (UBE).   * Continue with agility exercises.   * Advanced functional exercises.   * Isokinetic test.   * Functional test assessment.   * Full return to sporting activities.

## 2020-04-15 ENCOUNTER — Ambulatory Visit (INDEPENDENT_AMBULATORY_CARE_PROVIDER_SITE_OTHER): Payer: BC Managed Care – PPO | Admitting: Rehabilitative and Restorative Service Providers"

## 2020-04-15 ENCOUNTER — Encounter (INDEPENDENT_AMBULATORY_CARE_PROVIDER_SITE_OTHER): Payer: Self-pay | Admitting: Rehabilitative and Restorative Service Providers"

## 2020-04-15 DIAGNOSIS — M25312 Other instability, left shoulder: Secondary | ICD-10-CM

## 2020-04-15 NOTE — Interdisciplinary (Signed)
Physical Therapy Evaluation      Referring Physician Dennis Morales    Visit number: 1  Visits remaining on current referral: 11 out of 12    Current Referral Expiration Date: 03/26/2021    Insurance: BLUE CROSS    Allergies: Polytrim [polymyxin b-trimethoprim]    Interpreter Services: N/A     Diagnosis     ICD-10-CM ICD-9-CM    1. Instability of left shoulder joint  M25.312 718.81        Preferred Language:English    Start of Service  Start of Care: 04/15/20  Onset date : 2.14.22              No past medical history on file.   Past Surgical History:   Procedure Laterality Date    INCISION AND DRAINAGE DEEP NECK ABSCESS Right     2 yoa             SUBJECTIVE:    PREOPERATIVE DIAGNOSIS:  Left shoulder malunion of glenoid fracture,  left shoulder instability, left shoulder posterior labral tear.      POSTOPERATIVE DIAGNOSIS:  Left shoulder malunion of glenoid fracture,  left shoulder instability, left shoulder posterior labral tear.      PROCEDURE PERFORMED:    1. Left shoulder arthroscopy, diagnostic;   2. Left shoulder repair of glenoid non union with open posterior augmentation  of bone with iliac crest.   2. Left shoulder posterior labral repair and capsulorrhaphy.  3. Left iliac crest bone graft harvest.     SURGEON/STAFF:  Dennis Mccreedy, MD     ASSISTANT:  None.     ANESTHESIA:  General.     BLOOD LOSS:  200 cc.     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  He has continued to have instability since that time.  This  has become worse over time and affected his ability to play football  and to do other activities.  I met with the patient and his mother on  several occasions to discuss the problem, which I discussed with them  was quite complicated given the degree of bone loss and the longevity  of the issue.  We obtained an MRI and CT scan.  After the workup, we  discussed operative and  nonoperative treatment options, risks,  benefits, and alternatives.  He elected to proceed with operative  care, and consent was obtained.      DESCRIPTION OF PROCEDURE:  The patient was appropriately identified  and marked in the preoperative area.  He was then brought back to the  operating room, where general anesthesia was induced.  He was placed  in the beach chair position with the extremities carefully padded and  protected.  Next, we prepped and draped the left iliac crest as well  as the left shoulder and upper extremity in normal sterile fashion.  Prior to incision, time-out was done, and antibiotics were given.  We  began by exam under anesthesia.  The patient had severe laxity  posteriorly of the left shoulder.  He had normal anterior stability.     Next, we marked all incisions.  We then made a posterior portal to  the glenohumeral joint.  We then triangulated to place an anterior  portal and cannula.  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  He had significant bone  loss of the posterior glenoid with partial obliteration of the  glenoid labrum.  The rotator cuff, biceps, superior labrum, and  anterior labrum were intact.  There were no loose bodies.  After this  initial arthroscopy, I did some minor debridement, but felt this  would be better addressed open.       Therefore, I removed my arthroscopic  instruments.  We then moved to the iliac crest.  We had previously  prepped and draped this area.  I made a 4 cm incision at the iliac  crest several centimeters posterior to the ASIS.  I dissected through  subcutaneous tissue, obtaining hemostasis as needed.  I then was able  to palpate the crest and used a Bovie electrocautery to split the  fascia and dissect this both superficial and deep.  Once this was  done, I used a Cobb  elevator to clear off the iliac crest of soft  tissue.  I then placed a deep wide Hohmann retractor to protect the  intrapelvic contents.  I then measured a 2.5 cm bone block with 1.5  cm depth.  I used an oscillating saw to cut my bone block.  I then  used an osteotome to free this up.  Once this was done, I was able to  remove this with a Kocher clamp.  I brought this to the back table.  I removed a small amount of additional muscle.  I then went back to  the iliac crest site.  I thoroughly irrigated and obtained  hemostasis.  I then closed the fascial layer using 0 Vicryl sutures,  the subcutaneous tissue with 2-0 Monocryl, and the skin with 3-0  Monocryl.  We placed local anesthetic.  We then placed Dermabond to  seal the skin and a sterile dressing.       Once this was done, I went to  the back table.  I did some shaping of my bone block to ensure that  it was an appropriate size.  I then set this aside.  I then moved to  the posterior shoulder.  I made a 7 cm incision from the arthroscopic  portal distally along the joint line and just medial to that.  I then  dissected through subcutaneous tissue.  I split the deltoid fascia.  I then carefully split the deltoid fibers using Metzenbaum scissors  as well as electrocautery.  I dissected down until I had encountered  the rotator cuff.  I then found the interval between the teres minor  and the infraspinatus.  This was cut in line with the raphe.  I then  used 2 [employer] retractors to pull these muscles superior and  inferior.  This gave me good exposure of the patient's capsule.  I  then made an incision through the capsule sharply using a 15 blade.  The incision was made at the level of the glenoid and just medial.  I  then brought this flap up and marked it with a #1 Ethibond.  Of note,  the patient had a very patulous and thinned out capsule, likely  related to his previous dislocations.  Once this was done, I was able  to place a Fukuda clamp within the  joint.  I then placed a  double-pronged Hohmann along the glenoid neck.  I then used an  elevator to free along the glenoid neck.  I was able  to see the  nonunion fragment, which was essentially a ridge on the medial  glenoid.  I then used a burr to remove this abnormal area of bone and  to create a good surface for placement of the bone graft.  I  continued to expose this area until I had appropriate exposure.  Once  this was done, I debrided a small amount of labrum.  Unfortunately,  there was really not much to repair given the degree of damage.  Next, I at the back table drilled two 4 mm drill holes through my  bone block.  I placed this on the positioning clamp from [employer].  I  then brought this into the joint and pinned it into place.  I then  adjusted as necessary until I was very satisfied with my placement  both superior to anterior as well as medial to lateral.  I then  over-drilled my pins with a 2.7 mm drill bit.  I measured and placed  two 38 mm 3.75 mm screws.  These were fully-threaded cannulated  screws from [employer].  This seated nicely with excellent bite.  The  patient had very good purchase on the bone.  Once this was in place,  I checked my stability, which was nicely satisfied.  I took final  images, showing that the screws were in appropriate placement, as was  the bone graft.       Once this was done, I thoroughly irrigated within  the joint.  Before closing the joint, I placed a JuggerKnot suture  anchor at the 8 o'clock position at the glenoid.  This allowed me to  sew down the posterior capsule.  I also used the sutures to over-tie  the previously placed Ethibond sutures in the capsule.  This gave me  a good capsular repair.  Once this was done, I irrigated again.  I  removed my deep retractors.  I closed the interval between the  infraspinatus and teres with 0 Vicryl as well as the deltoid fascia.  I then closed subcutaneous tissue with 2-0 Monocryl and skin with 3-0  Monocryl.  I placed  Dermabond to seal the skin.  I injected local  anesthetic.  We placed a sterile dressing as well as an UltraSling.  The patient was then extubated uneventfully and taken to  postoperative care unit in stable condition.        Onset date: 2.14.22   Mechanism of injury: history of multiple dislocations. leania gf present.     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  He has continued to have instability since that time.  This has become worse over time and affected his ability to play football and to do other activities.   He elected to have surgery and now post operative Left shoulder malunion of glenoid fracture, left shoulder instability, left shoulder posterior labral tear. DOS 2.14.22.  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       OBJECTIVE :    Cervical Active Range of Motion      (degrees)   Rotation Left  55   Rotation Right  65   Extension 60   Flexion   60   *=Pain    Shoulder Active Range of Motion     Left ROM (degrees) Right ROM (degrees)   Flexion Not Tested  160   Abduction Not Tested  160    Apley IR Not Tested  Thoracolumbar junction    Apley ER  Thoracic Spinous Process level 2     *=Pain    Shoulder Manual Muscle Testing (MMT)    Deferred      Left Strength Right Strength   Abduction /5 /5   Flexion /5 /5   GH ER /5 /5   GH  IR /5 /5   Scaption /5 /5     Shoulder Passive Range of Motion      Left  ROM (degrees) Right ROM (degrees)   Flexion 90 160   GH ER Deferred  110   GH IR Deferred  50    *=Pain        Mobility:  hypermobile Right       Special Tests:    Deferred             PT Tool Box     Row Name 04/15/20 1600          Quick DASH    Open a tight or new jar. 5 -  Unable     Do heavy household chores (e.g., wash walls, floors). 5 -  Unable     Carry a shopping bag or briefcase. 5 -  Unable     Wash your back. 5 - Unable     Use a knife to cut food. 5 - Unable     Recreational activities in which you take some force or impact through your arm, shoulder or hand (e.g., golf, hammering, tennis, etc.). 5 - Unable     During the past week, to what extent has your arm, shoulder or hand problem interfered with your normal social activities with family, friends, neighbors or groups? 5 - Extremely     During the past week, were you limited in your work or other regular daily activities as a result of your arm, shoulder or hand problem? 3 - Moderately limited     Arm, shoulder or hand pain. 1 -  None     Tingling (pins and needles) in your arm, shoulder or hand. 1 -  None     During the past week, how much difficulty have you had sleeping because of the pain in your arm, shoulder or hand? 4 - Severe difficulty     Quick DASH Total Score 75     Quick  DASH Assessment 60-79% impaired; SCR 75                 ASSESSMENT:      Rehabilitation Potential: Dennis Morales is a 20 year old male.  Upon evaluation today, his primary impairments include limited and painful AROM / PROM of the Left Glenohumeral  strength deficits to Left Glenohumeral .  Dennis Morales currently demonstrates pain/difficulty with using Left  shoulder , which limits his ability to perform meaningful and daily activities.  Signs and symptoms consistent with post operative Left shoulder arthroscopy, diagnostic; 2. Left shoulder repair of glenoid non union with open posterior augmentation of bone with iliac crest. 2. Left shoulder posterior labral repair and capsulorrhaphy.3. Left iliac crest bone graft harvest..  Dennis Morales is a good candidate for physical therapy and should respond well to treatments to address aforementioned impairments in order to optimize movement and aid patient in returning to prior level of function.  Discussed evaluation findings with Dennis Morales and educated on physical therapy potential and patient is agreeable with plan of care.  All patients questions were answered.     Dennis Morales was given a HEP (home exercise plan) to begin addressing evaluation findings and was educated on how to perform effectively and safely.  Dennis Morales was educated on and shown how to perform each exercise safely and effectively 1 on 1.  he then performed and demonstrated each exercise. Dennis Morales was educated on importance of each exercise as it relates to their findings during the evaluation.  Patient's HEP (home exercise plan) was printed and handed to them today and or sent to patient using mychart.  Instructions were provided on their HEP printout regarding repetitions, sets, and frequency.  Patient education given regarding pain modulation with use of ice 10-15 minutes using a barrier for use with gel type cryotherapy products.    Plan:    Planned Therapy Interventions: Gait Training, Manual Therapy, Neuromuscular Re-Education, Patient Education, Therapeutic Activity and Therapeutic Exercise    Continue therapy to address : Activity tolerance limitation     Recommend continued visit  2 time per week  x duration of 12 weeks   - patient notes he may transfer to therapist he has worked with before in [employer]. Patient education given regarding transfer if needed.     For  next visit:  - scapula Proprioceptive Neuromuscular Facilitation.  Slow Reversals   - isometrics  Review home exercise program  Reassess impairments  Manual therapy for tissue mobility   Therapeutic exercise and or progression of home exercise program    Home Exercise Program:    Access Code: [email or web address]  URL: [email or web address]  Date: 04/15/2020  Prepared by: Gaspar Bidding    Exercises  Standing Backward Shoulder Rolls - 1 x daily - 5 x weekly - 3 sets - 10 reps  Seated Shoulder Flexion Towel Slide at Table Top - 1 x daily - 5 x weekly - 3 sets - 10 reps  Seated Shoulder Abduction Towel Slide at Table Top - 1 x daily - 5 x weekly - 3 sets - 10 reps                           PHYSICAL THERAPY PRESCRIPTION:  LEFT SHOULDER POSTERIOR  ARTHROSCOPIC STABILIZATION WITH ILIAC CREST BONE GRAFT (L)     RECOVERY / RECUPERATION PHASE:  WEEKS 1 - 4              *  Immobilization in sling x 4 weeks except for exercises              * Elbow A/AAROM: flexion and extension.  * Protect POSTERIOR capsule from stretch. Limit IR to neutral, Horiz ABD, to scapular plane.              * Modalities (i.e. CryoCuff) PRN.              * Wrist and gripping exercises.  * Deltoid isometrics.              * Grip strengthening              *Light soft tissue for L iliac crest and shoulder.  Gentle hip and core program  WEEKS 4 - 6              * At 4 weeks PROM: pulley for flexion, pendulum exercises.              * Pool exercises: A/AAROM flexion, extension, horiz. addctn, elbow flex & extension.              * Deltoid isometrics.              * Lightly resisted elbow flexion.              *Scapular and cuff isometrics              * Continue with wrist ex.              * Modalities PRN.              * Discontinue sling at 6 weeks; may be part time at 4 weeks  WEEKS 6 - 12              * 6-10 weeks, gradual A/AA/PROM to improve ER with arm at side.              * Progress flexion to tolerance.              * 10-12 weeks, A/AA/PROM to improve  IR/ER              * Pool exercises AROM all directions below horizontal, light resisted motions in all planes.              * AROM activities to restore flexion, IR, ER, horiz ADD.              * Deltoid, Rotator Cuff isometrics progressing to isotonics.              * PREs for scapular muscles, latissimus, biceps, triceps.              * PREs work rotators in isolation (use modified neutral).              * Joint mobilization (posterior glides).              * Emphasize posterior cuff, latissimus, & scapular muscle strengthen, stress eccentrics.              * Utilize exercise arcs that protect anterior capsule from stress during PREs.              * Keep all strength exercises below the horizontal plane in this phase.  WEEKS 12 - 16                * AROM activities to restore full ROM.              *  Restore scapulohumeral rhythm.              * Joint mobilization.              * Aggressive scapular stabilization and eccentric strengthening program.              * PREs for all upper quarter musculature (begin to integrate upper extremity        patterns). Continue to emphasize eccentrics and glenohumeral stabilization. All PREs       are  below the horizontal plane for non-throwers.              * Begin isokinetics.  WEEKS 16 - 24                * Begin muscle endurance activities (UBE).              * Continue with agility exercises.              * Advanced functional exercises.              * Isokinetic test.              * Functional test assessment.              * Full return to sporting activities.           Physical Therapy Documentation     [place] Name 04/15/20 1600          Goal 1 (Short Term)    Impairment Education need     Education need Patient able to return demonstrate Home Exercise Program independently to enable patient to achieve stated functional goals     Number of visits 3-5     Goal Status [place] Name 04/15/20 1600          Goal 2 (Short Term)    Impairment Range of motion limitations      Custom goal L=R     Number of visits 10-14     Goal Status New     Row Name 04/15/20 1600          Goal 3 (Short Term)    Impairment Strength limitations     Custom goal 5-/5     Number of visits 10-14     Goal Status New     Row Name 04/15/20 1600          GOAL (Long Term 1)    Long Term Goal --  QDASH     Long Term Custom Goal Patient will demonstrate at least a 15.9 point improvement on the shortened version of Disabilities of the Arm, Shoulder and Hand (QuickDASH), demonstrating a significant change and improvement in their ability to perform functional, meaningful, and daily tasks along with demonstrating a general improvement of symptoms.  Renae Gloss & Tucker, [place], [place], Henry Bravini, Elisabetta & Cordelia Pen. (2013). Minimal Clinically Important Difference of the Disabilities of the Arm, Shoulder and Hand Outcome Measure (DASH) and Its Shortened Version (QuickDASH). The Journal of orthopaedic and sports physical therapy. 44. 10.2519/jospt.0258.5277     Long Term Goal Number of visits 10-14     Long Term Goal Status New     Row Name 04/15/20 1600          Outpatient Treatment Plan    Continue therapy to address  Activity tolerance limitation     OP Treatment Frequency 2 times per week  OP Treatment Duration 12 weeks     OP Status of treatment Patient evaluated and will benefit from ongoing skilled therapy     Row Name 04/15/20 1600          Outpatient Therapy Overview    Start of Care 04/15/20     Onset date  2.14.22     Row Name 04/15/20 1600          Type of Eval    Low Complexity [phone]) Completed     Row Name 04/15/20 1600          Therapeutic Procedures    Therapeutic exercise  (21308)  --  therex        Total TIMED Treatment (min)  17     Row Name 04/15/20 1600          Treatment Time     Total TIMED Treatment  (min) 45     Total Treatment Time (min) 60                   The physical therapist of record is endorsed by evaluating physical  therapist.

## 2020-04-22 ENCOUNTER — Ambulatory Visit (INDEPENDENT_AMBULATORY_CARE_PROVIDER_SITE_OTHER): Payer: BC Managed Care – PPO | Admitting: Rehabilitative and Restorative Service Providers"

## 2020-04-22 ENCOUNTER — Encounter (INDEPENDENT_AMBULATORY_CARE_PROVIDER_SITE_OTHER): Payer: Self-pay | Admitting: Rehabilitative and Restorative Service Providers"

## 2020-04-22 DIAGNOSIS — M25312 Other instability, left shoulder: Secondary | ICD-10-CM

## 2020-04-22 NOTE — Interdisciplinary (Signed)
PHYSICAL THERAPY DAILY TREATMENT NOTE    Referring Physician: Fredia Sorrow    Visit number: 2  Visits remaining on current referral: 10 out of 12    Current Referral Expiration Date: 03/26/2021    Insurance: BLUE CROSS    No past medical history on file.    Past Surgical History:   Procedure Laterality Date    INCISION AND DRAINAGE DEEP NECK ABSCESS Right     2 yoa       Allergies: Polytrim [polymyxin b-trimethoprim]  Current Outpatient Medications   Medication Sig Dispense Refill    docusate sodium (COLACE) 250 MG capsule Take 1 capsule (250 mg) by mouth 2 times daily. 20 capsule 0    famotidine (PEPCID) 20 MG tablet Take 1 tablet (20 mg) by mouth 2 times daily. 60 tablet 0    magnesium citrate 1.745 GM/30ML SOLN Take 100 mL by mouth daily. 195 mL 1    naproxen (NAPROSYN) 500 MG tablet Take 1 tablet (500 mg) by mouth 2 times daily (with meals). 40 tablet 0    ondansetron (ZOFRAN ODT) 4 MG disintegrating tablet Take 1 tablet (4 mg) on or under the tongue every 8 hours as needed for Nausea/Vomiting. 12 tablet 0    oxyCODONE-acetaminophen (PERCOCET) 5-325 MG tablet Take 1 tablet by mouth every 4 hours as needed for Severe Pain (Pain Score 7-10). 40 tablet 0    polyethylene glycol (GLYCOLAX) 17 GM/SCOOP powder Take 17 g by mouth daily. Mix with 4 to 8 ounces of fluid (water, juice, soda, coffee, or tea) prior to administration. 255 g 0    senna (SENOKOT) 8.6 MG tablet Take 1 tablet (8.6 mg) by mouth daily. 30 tablet 0     No current facility-administered medications for this visit.       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  Patient agreeable with abbreviated therapy session today.         Patient states doing well no irritation in shoulder , he notes doing  therapeutic exercise  Given at evaluation daily. . Per Patient pain is 0/10. Patient able to recall  Home exercise program  .      OBJECTIVE:   deltoid isom    Shoulder Active Range of Motion     Left ROM (degrees) Right ROM (degrees)   Flexion 140 160   Abduction 140 160   Apley IR Not Tested  Thoracolumbar junction    Apley ER  Thoracic Spinous Process level 2     *=Pain            ASSESSMENT:  Dennis Morales is overall improving as noted in objective findings with range of motion and Glenohumeral mobility  .   Patient continues to present with limited function with post operative weakness and protection of surgery site as evidence of need for continued skilled care to address impairments to reach goals set in plan of care and to return to previous level of function.    Exercises remain appropriately challenging and progressions are made as tolerated. Patient encouraged to remain adherent to their home exercise program as tolerated.Patient was educated on and shown how to  perform each exercise safely and effectively 1 on 1.  Patient then performed and demonstrated each exercise. Patient was educated on importance of each exercise as it relates to their findings.  Patient's HEP (home exercise plan) was printed and handed to them today.  Instructions were provided on their HEP printout regarding repetitions, sets, and frequency.       Plan:       Continue therapy to address : Activity tolerance limitation     Recommend continued visit  1 time per week  x duration of 12 weeks       - Rhythmic Stabilization isometrics   - next week wean from brace, still wear in public.       Home exercise program:  Access Code: [email or web address]  URL: [email or web address]  Date: 04/22/2020  Prepared by: Judie Grieve    Exercises  Standing Backward Shoulder Rolls - 1 x daily - 5 x weekly - 3 sets - 10 reps  Seated Shoulder Flexion Towel Slide at Table Top - 1 x daily - 5 x weekly - 3 sets - 10 reps  Seated Shoulder Abduction Towel Slide at  Table Top - 1 x daily - 5 x weekly - 3 sets - 10 reps  Circular Shoulder Pendulum with Table Support - 1 x daily - 5 x weekly - 3 sets - 10 reps  Isometric Shoulder Abduction at Wall - 1 x daily - 5 x weekly - 3 sets - 10 reps            Therapeutic Procedures         Total TIMED Treatment (min)   30         Manual therapy   - Soft Tissue Mobilization scar ,   Glenohumeral mobilizations   Glenohumeral Extension Repated Contractions   - Rhythmic Stabilization closed pack   - scapula Slow Reversals Proprioceptive Neuromuscular Facilitation.  Slow Reversals D1 , D2           Total TIMED Treatment (min)   15         Therapeutic exercise    -deltoid isometrics.   - patient education and home exercise program review                       Physical Therapy Documentation     Row Name 04/22/20 1400          Goal 1 (Short Term)    Impairment Education need     Education need Patient able to return demonstrate Home Exercise Program independently to enable patient to achieve stated functional goals     Number of visits 3-5     Goal Status Continue     Row Name 04/22/20 1400          Goal 2 (Short Term)    Impairment Range of motion limitations     Custom goal L=R     Number of visits 10-14     Goal Status Continue     Row Name 04/22/20 1400          Goal 3 (Short Term)    Impairment Strength limitations     Custom goal 5-/5     Number of visits 10-14     Goal Status Continue     Row Name 04/22/20 1400          GOAL (Long Term 1)    Long Term Goal --  QDASH     Long Term Custom Goal  Patient will demonstrate at least a 15.9 point improvement on the shortened version of Disabilities of the Arm, Shoulder and Hand (QuickDASH), demonstrating a significant change and improvement in their ability to perform functional, meaningful, and daily tasks along with demonstrating a general improvement of symptoms.  Darla Lesches & [place], [place] & Howell Pringle & Sartorio, Reyne Dumas & Bravini, Elisabetta & Verner Mould. (2013).  Minimal Clinically Important Difference of the Disabilities of the Arm, Shoulder and Hand Outcome Measure (DASH) and Its Shortened Version (QuickDASH). The Journal of orthopaedic and sports physical therapy. 44. [email or web address]/[email or web address]     Long Term Goal Number of visits 10-14     Long Term Goal Status Continue     Row Name 04/22/20 1400          Outpatient Treatment Plan    Continue therapy to address  Activity tolerance limitation     OP Treatment Frequency 2 times per week     OP Treatment Duration 12 weeks     OP Status of treatment Patient evaluated and will benefit from ongoing skilled therapy     Row Name 04/22/20 1400          Outpatient Therapy Overview    Start of Care 04/15/20     Onset date  2.14.22     Row Name 04/22/20 1400          Therapeutic Procedures    Manual therapy (97140)  --  manual        Total TIMED Treatment (min)  30     Therapeutic exercise  (97110)  --  therex        Total TIMED Treatment (min)  15     Row Name 04/22/20 1400          Treatment Time     Total TIMED Treatment  (min) 45     Total Treatment Time (min) 45

## 2020-04-25 ENCOUNTER — Other Ambulatory Visit (HOSPITAL_BASED_OUTPATIENT_CLINIC_OR_DEPARTMENT_OTHER): Payer: Self-pay | Admitting: Student in an Organized Health Care Education/Training Program

## 2020-04-25 DIAGNOSIS — R1013 Epigastric pain: Secondary | ICD-10-CM

## 2020-05-02 ENCOUNTER — Ambulatory Visit (INDEPENDENT_AMBULATORY_CARE_PROVIDER_SITE_OTHER): Payer: BC Managed Care – PPO | Admitting: Rehabilitative and Restorative Service Providers"

## 2020-05-02 DIAGNOSIS — M25312 Other instability, left shoulder: Secondary | ICD-10-CM

## 2020-05-02 NOTE — Interdisciplinary (Signed)
PHYSICAL THERAPY DAILY TREATMENT NOTE    Referring Physician: Fredia Morales    Visit number: 3  Visits remaining on current referral: 10 out of 12    Current Referral Expiration Date: 03/26/2021    Insurance: BLUE CROSS    No past medical history on file.    Past Surgical History:   Procedure Laterality Date    INCISION AND DRAINAGE DEEP NECK ABSCESS Right     2 yoa       Allergies: Polytrim [polymyxin b-trimethoprim]  Current Outpatient Medications   Medication Sig Dispense Refill    docusate sodium (COLACE) 250 MG capsule Take 1 capsule (250 mg) by mouth 2 times daily. 20 capsule 0    famotidine (PEPCID) 20 MG tablet Take 1 tablet (20 mg) by mouth 2 times daily. 60 tablet 0    magnesium citrate 1.745 GM/30ML SOLN Take 100 mL by mouth daily. 195 mL 1    naproxen (NAPROSYN) 500 MG tablet Take 1 tablet (500 mg) by mouth 2 times daily (with meals). 40 tablet 0    ondansetron (ZOFRAN ODT) 4 MG disintegrating tablet Take 1 tablet (4 mg) on or under the tongue every 8 hours as needed for Nausea/Vomiting. 12 tablet 0    oxyCODONE-acetaminophen (PERCOCET) 5-325 MG tablet Take 1 tablet by mouth every 4 hours as needed for Severe Pain (Pain Score 7-10). 40 tablet 0    polyethylene glycol (GLYCOLAX) 17 GM/SCOOP powder Take 17 g by mouth daily. Mix with 4 to 8 ounces of fluid (water, juice, soda, coffee, or tea) prior to administration. 255 g 0    senna (SENOKOT) 8.6 MG tablet Take 1 tablet (8.6 mg) by mouth daily. 30 tablet 0     No current facility-administered medications for this visit.       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  Patient agreeable with abbreviated therapy session today.        Patient states he is doing well, he notes seeing a [place] Monday and  wednesday in [place] for his care as well per patient, utilizing moist heat pack , instrument assisted soft tissue mobilization pendulum, and isometrics. Per Patient pain is 0/10. Patient able to recall  Home exercise program  .      OBJECTIVE:      Shoulder Active Range of Motion     Left ROM (degrees) Right ROM (degrees)   Flexion 153 160   Abduction 155 160   Apley IR Lumbar spinous process 2  Thoracolumbar junction   Apley ER Thoracic Spinous Process level 2  Thoracic Spinous Process level 2    *=Pain           Shoulder Passive Range of Motion      Left  ROM (degrees) Right ROM (degrees)   Flexion 155    GH ER 60 90   GH IR 35     *=Pain        ASSESSMENT:  Dennis Morales is overall improving as noted in objective findings with increased range of motion and funciton with Left Upper Extremity  .   Patient continues to present with limited overhead  strengthening  as evidence of need for continued skilled care to address impairments to reach goals set in plan of care and to return to previous level of function.    Exercises remain appropriately challenging and progressions are made as tolerated. Patient encouraged to remain adherent to their home exercise program as tolerated.Patient was educated on and shown how to perform each exercise safely and effectively 1 on 1.  Patient then performed and demonstrated each exercise. Patient was educated on importance of each exercise as it relates to their findings.  Patient's HEP (home exercise plan) was printed and handed to them today.  Instructions were provided on their HEP printout regarding repetitions, sets, and frequency.       Plan:       Continue therapy to address : Activity tolerance limitation     Recommend continued visit 1 time per week  x duration of 12 weeks     - start gentle shoulder strength   - discharge sling wean , may use in public if needed     Home exercise program:    Access Code: Dennis Morales  URL: [email or web address]  Date:  05/02/2020  Prepared by: Dennis Morales    Exercises  Standing Backward Shoulder Rolls - 1 x daily - 5 x weekly - 3 sets - 10 reps  Seated Shoulder Flexion Towel Slide at Table Top - 1 x daily - 5 x weekly - 3 sets - 10 reps  Seated Shoulder Abduction Towel Slide at Table Top - 1 x daily - 5 x weekly - 3 sets - 10 reps  Circular Shoulder Pendulum with Table Support - 1 x daily - 5 x weekly - 3 sets - 10 reps  Isometric Shoulder Abduction at Wall - 1 x daily - 5 x weekly - 3 sets - 10 reps  Shoulder Extension with Resistance - 1 x daily - 5 x weekly - 3 sets - 10 reps  Standing Bilateral Low Shoulder Row with Anchored Resistance - 1 x daily - 5 x weekly - 3 sets - 10 reps      Therapeutic Procedures         Total TIMED Treatment (min)   30         Manual therapy   - Glenohumeral mobilizations   - GH External Rotation Repated Contractions    - Rhythmic Stabilization Glenohumeral External Rotation / Internal Rotation   Glenohumeral flexion Extension D2 pattern   - Proprioceptive Neuromuscular Facilitation.  Slow Reversals           Total TIMED Treatment (min)   15         Therapeutic exercise    -Glenohumeral Extension blue Theraband 3 x 10   - Glenohumeral row to midline 3 x 10  greyTheraband                PHYSICAL THERAPY PRESCRIPTION:  LEFT SHOULDER POSTERIOR  ARTHROSCOPIC STABILIZATION WITH ILIAC CREST BONE GRAFT (L)     RECOVERY / RECUPERATION PHASE:  WEEKS 1 - 4              * Immobilization in sling x 4 weeks except for exercises              * Elbow A/AAROM: flexion and extension.  * Protect POSTERIOR capsule from stretch. Limit IR to neutral, Horiz ABD, to scapular plane.              * Modalities (i.e. CryoCuff) PRN.              *  Wrist and gripping exercises.  * Deltoid isometrics.              * Grip strengthening              *Light soft tissue for L iliac crest and shoulder.  Gentle hip and core program  WEEKS 4 - 6              * At 4 weeks PROM: pulley for flexion, pendulum exercises.              * Pool  exercises: A/AAROM flexion, extension, horiz. addctn, elbow flex & extension.              * Deltoid isometrics.              * Lightly resisted elbow flexion.              *Scapular and cuff isometrics              * Continue with wrist ex.              * Modalities PRN.              * Discontinue sling at 6 weeks; may be part time at 4 weeks  WEEKS 6 - 12              * 6-10 weeks, gradual A/AA/PROM to improve ER with arm at side.              * Progress flexion to tolerance.              * 10-12 weeks, A/AA/PROM to improve IR/ER              * Pool exercises AROM all directions below horizontal, light resisted motions in all planes.              * AROM activities to restore flexion, IR, ER, horiz ADD.              * Deltoid, Rotator Cuff isometrics progressing to isotonics.              * PREs for scapular muscles, latissimus, biceps, triceps.              * PREs work rotators in isolation (use modified neutral).              * Joint mobilization (posterior glides).              * Emphasize posterior cuff, latissimus, & scapular muscle strengthen, stress eccentrics.              * Utilize exercise arcs that protect anterior capsule from stress during PREs.              * Keep all strength exercises below the horizontal plane in this phase.  WEEKS 12 - 16                * AROM activities to restore full ROM.              * Restore scapulohumeral rhythm.              * Joint mobilization.              * Aggressive scapular stabilization and eccentric strengthening program.              * PREs for all upper quarter musculature (begin to integrate upper extremity  patterns). Continue to emphasize eccentrics and glenohumeral stabilization. All PREs       are  below the horizontal plane for non-throwers.              * Begin isokinetics.  WEEKS 16 - 24                * Begin muscle endurance activities (UBE).              * Continue with agility exercises.              * Advanced functional exercises.               * Isokinetic test.              * Functional test assessment.              * Full return to sporting activities.         Physical Therapy Documentation     Row Name 05/02/20 1500          Goal 1 (Short Term)    Impairment Education need     Education need Patient able to return demonstrate Home Exercise Program independently to enable patient to achieve stated functional goals     Number of visits 3-5     Goal Status Continue     Row Name 05/02/20 1500          Goal 2 (Short Term)    Impairment Range of motion limitations     Custom goal L=R     Number of visits 10-14     Goal Status Continue     Row Name 05/02/20 1500          Goal 3 (Short Term)    Impairment Strength limitations     Custom goal 5-/5     Number of visits 10-14     Goal Status Continue     Row Name 05/02/20 1500          GOAL (Long Term 1)    Long Term Goal --  QDASH     Long Term Custom Goal Patient will demonstrate at least a 15.9 point improvement on the shortened version of Disabilities of the Arm, Shoulder and Hand (QuickDASH), demonstrating a significant change and improvement in their ability to perform functional, meaningful, and daily tasks along with demonstrating a general improvement of symptoms.  Darla Lesches & [employer], [place] & Howell Pringle & Sartorio, Reyne Dumas & Bravini, Elisabetta & Verner Mould. (2013). Minimal Clinically Important Difference of the Disabilities of the Arm, Shoulder and Hand Outcome Measure (DASH) and Its Shortened Version (QuickDASH). The Journal of orthopaedic and sports physical therapy. 44. [email or web address]     Long Term Goal Number of visits 10-14     Long Term Goal Status Continue     Row Name 05/02/20 1500          Outpatient Treatment Plan    Continue therapy to address  Activity tolerance limitation     OP Treatment Frequency 2 times per week     OP Treatment Duration 12 weeks     OP Status of treatment Patient evaluated and will benefit from ongoing skilled therapy     Row  Name 05/02/20 1500          Outpatient Therapy Overview    Start of Care 04/15/20     Onset date  2.14.22     Row Name 05/02/20 1500  Therapeutic Procedures    Manual therapy (97140)  --  manual        Total TIMED Treatment (min)  30     Therapeutic exercise  (83094)  --  therex        Total TIMED Treatment (min)  15     Row Name 05/02/20 1500          Treatment Time     Total TIMED Treatment  (min) 45     Total Treatment Time (min) 45

## 2020-05-09 ENCOUNTER — Encounter (INDEPENDENT_AMBULATORY_CARE_PROVIDER_SITE_OTHER): Payer: Self-pay | Admitting: Rehabilitative and Restorative Service Providers"

## 2020-05-09 ENCOUNTER — Ambulatory Visit (INDEPENDENT_AMBULATORY_CARE_PROVIDER_SITE_OTHER): Payer: BC Managed Care – PPO | Admitting: Rehabilitative and Restorative Service Providers"

## 2020-05-09 NOTE — Interdisciplinary (Signed)
Dennis Morales, did not show for scheduled physical therapy appointment , 05/09/2020.        Note per reception patient called in to state he had a flat tire at 4:18pm for 4 pm appointment.     [name] PT, DPT, OCS, SCS, ATC

## 2020-05-12 ENCOUNTER — Ambulatory Visit (INDEPENDENT_AMBULATORY_CARE_PROVIDER_SITE_OTHER): Payer: BC Managed Care – PPO | Admitting: Sports Medicine

## 2020-05-12 ENCOUNTER — Encounter (INDEPENDENT_AMBULATORY_CARE_PROVIDER_SITE_OTHER): Payer: Self-pay | Admitting: Sports Medicine

## 2020-05-12 ENCOUNTER — Other Ambulatory Visit (INDEPENDENT_AMBULATORY_CARE_PROVIDER_SITE_OTHER)
Admit: 2020-05-12 | Discharge: 2020-05-12 | Disposition: A | Discharge status: Home Health | Payer: BC Managed Care – PPO

## 2020-05-12 DIAGNOSIS — S4992XA Unspecified injury of left shoulder and upper arm, initial encounter: Secondary | ICD-10-CM

## 2020-05-12 DIAGNOSIS — Z09 Encounter for follow-up examination after completed treatment for conditions other than malignant neoplasm: Secondary | ICD-10-CM

## 2020-05-12 DIAGNOSIS — M25312 Other instability, left shoulder: Secondary | ICD-10-CM

## 2020-05-12 NOTE — Progress Notes (Signed)
[facility] SURG[facility], SPORTS MEDICINE FOLLOWUP    PRIMARY CARE PROVIDER:   Brock Ra    PROCEDURE:  1. Left shoulder arthroscopy, diagnostic;  2. 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   2. Left shoulder posterior labral repair and capsulorrhaphy.  3. Left iliac crest bone graft harvest.    DATE OF SURGERY: 03/31/2020     HISTORY: Dennis Morales is a 20 year old male who presents today for post operative evaluation of the shoulder.  He is now 6 weeks postop.  He is doing well with minimal pain.  He is attending physical therapy.  They are primarily working on range of motion as well as doing some band work.      Past medical history, medications, allergies, review of systems are unchanged since the previous visit.    PHYSICAL EXAM:  VITALS: There were no vitals taken for this visit., There is no height or weight on file to calculate BMI.,  GENERAL: A+Ox3. INAD. Well appearing and well groomed. Normal gait.  MENTAL STATUS: Pleasant and cooperative. Alert and oriented x3 with normal mood and affect.  Vascular: Pulses are palpable 2+, capillary refills to digits are normal.  Skin: No wounds/lesions/ulcers. Skin warm & dry.  Respiratory: No respiratory distress.    UPPER EXTREMITY:   Normal posture.  Shoulder: No swelling.  Incisions are healing well.  Range of Motion: Forward Flexion 180/170. External Rotation 60/60. IR L1/L1.  Rotator cuff strength is 5-/5.  Stability:  No apprehension with adduction.    Sensation intact to light touch in the bilateral upper extremities.  2+ radial pulses.    Imaging:  Radiographs look appropriate for 6 weeks.  Bone graft is in place.  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  I think he is doing well at 6 weeks.  He may continue light range of motion work.  He may do scapular, biceps, triceps  strengthening.  He will keep weights low.  He can served band work for the rotator cuff in 2 weeks.  Recheck in 6 weeks for new radiographs.      -Patient verbalizes understanding of all discussions today, and all questions were answered satisfactorily.    I saw the patient, performed a physical exam, and developed the treatment plan.    I edited the note and agree with the content as written.    See PA/resident note for full detail.

## 2020-05-27 ENCOUNTER — Ambulatory Visit (INDEPENDENT_AMBULATORY_CARE_PROVIDER_SITE_OTHER): Payer: BC Managed Care – PPO | Admitting: Rehabilitative and Restorative Service Providers"

## 2020-05-29 ENCOUNTER — Encounter (INDEPENDENT_AMBULATORY_CARE_PROVIDER_SITE_OTHER): Payer: Self-pay | Admitting: Hospital

## 2020-06-03 ENCOUNTER — Ambulatory Visit (INDEPENDENT_AMBULATORY_CARE_PROVIDER_SITE_OTHER): Payer: BC Managed Care – PPO | Admitting: Rehabilitative and Restorative Service Providers"

## 2020-06-10 ENCOUNTER — Ambulatory Visit (INDEPENDENT_AMBULATORY_CARE_PROVIDER_SITE_OTHER): Payer: BC Managed Care – PPO | Admitting: Rehabilitative and Restorative Service Providers"

## 2020-06-10 DIAGNOSIS — M25312 Other instability, left shoulder: Secondary | ICD-10-CM

## 2020-06-10 NOTE — Interdisciplinary (Signed)
PHYSICAL THERAPY DAILY TREATMENT NOTE    Referring Physician: Fredia Sorrow    Visit number: 5  Visits remaining on current referral: 8 out of 12    Current Referral Expiration Date: 03/26/2021    Insurance: BLUE CROSS    No past medical history on file.    Past Surgical History:   Procedure Laterality Date    INCISION AND DRAINAGE DEEP NECK ABSCESS Right     2 yoa       Allergies: Polytrim [polymyxin b-trimethoprim]  Current Outpatient Medications   Medication Sig Dispense Refill    docusate sodium (COLACE) 250 MG capsule Take 1 capsule (250 mg) by mouth 2 times daily. 20 capsule 0    famotidine (PEPCID) 20 MG tablet Take 1 tablet (20 mg) by mouth 2 times daily. 60 tablet 0    magnesium citrate 1.745 GM/30ML SOLN Take 100 mL by mouth daily. 195 mL 1    naproxen (NAPROSYN) 500 MG tablet Take 1 tablet (500 mg) by mouth 2 times daily (with meals). 40 tablet 0    ondansetron (ZOFRAN ODT) 4 MG disintegrating tablet Take 1 tablet (4 mg) on or under the tongue every 8 hours as needed for Nausea/Vomiting. 12 tablet 0    oxyCODONE-acetaminophen (PERCOCET) 5-325 MG tablet Take 1 tablet by mouth every 4 hours as needed for Severe Pain (Pain Score 7-10). 40 tablet 0    polyethylene glycol (GLYCOLAX) 17 GM/SCOOP powder Take 17 g by mouth daily. Mix with 4 to 8 ounces of fluid (water, juice, soda, coffee, or tea) prior to administration. 255 g 0    senna (SENOKOT) 8.6 MG tablet Take 1 tablet (8.6 mg) by mouth daily. 30 tablet 0     No current facility-administered medications for this visit.       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   He is doing bicep curl 20# , DB bench 45# , bench press with bar 45#,  kesier press, he is doing Production assistant, radio  with trapezius  135 # , [place] Deadlift 165#,  Back squats 225# , planks, push ups,   running and resistance running. . Per Patient pain is 0/10. Patient notes workout with personal trainer, and seeing DC for soft tissue work graston, and shoulder exercises.      OBJECTIVE:         Shoulder Active Range of Motion     Left ROM (degrees) Right ROM (degrees)   Flexion 160 160   Abduction 160 160   Apley IR Thoracolumbar junction  Thoracolumbar junction   Apley ER Thoracic Spinous Process level 2  Thoracic Spinous Process level 2    *=Pain           Shoulder Passive Range of Motion      Left  ROM (degrees) Right ROM (degrees)   Flexion 160    GH ER 110 90   GH IR 60     *=Pain      Shoulder Manual Muscle Testing (MMT)     Left Strength Right Strength   Abduction 4+/5 5+/5  Flexion 4-/5 5/5   GH ER 4-/5 5/5   GH  IR 5/5 5/5   Scaption 4+/5 5/5           ASSESSMENT:    Patient continues to present with hypermobility, Glenohumeral weakness noted with Glenohumeral External Rotation and Glenohumeral flexion and progression in workouts faster than healing timeline , need guidance on progression as evidence of need for continued skilled care to address impairments to reach goals set in plan of care and to return to previous level of function.    Exercises remain appropriately challenging and progressions are made as tolerated. Patient encouraged to remain adherent to their home exercise program as tolerated.Patient was educated on and shown how to perform each exercise safely and effectively 1 on 1.  Patient then performed and demonstrated each exercise. Patient was educated on importance of each exercise as it relates to their findings.  Patient's HEP (home exercise plan) was printed and handed to them today.  Instructions were provided on their HEP printout regarding repetitions, sets, and frequency.       Plan:       Continue therapy to address : Activity tolerance limitation     Recommend continued visit 1  time per week  x duration of 12 weeks       - patient education given regarding training and may be performing tasks greater than healing timeline.   - patient needs Glenohumeral External Rotation strength and Glenohumeral flexion focus on Glenohumeral External Rotation strength       Home exercise program:  Access Code: [email or web address]  URL: [email or web address]  Date: 05/02/2020  Prepared by: Judie Grieve    Exercises  Standing Backward Shoulder Rolls - 1 x daily - 5 x weekly - 3 sets - 10 reps  Seated Shoulder Flexion Towel Slide at Table Top - 1 x daily - 5 x weekly - 3 sets - 10 reps  Seated Shoulder Abduction Towel Slide at Table Top - 1 x daily - 5 x weekly - 3 sets - 10 reps  Circular Shoulder Pendulum with Table Support - 1 x daily - 5 x weekly - 3 sets - 10 reps  Isometric Shoulder Abduction at Wall - 1 x daily - 5 x weekly - 3 sets - 10 reps  Shoulder Extension with Resistance - 1 x daily - 5 x weekly - 3 sets - 10 reps  Standing Bilateral Low Shoulder Row with Anchored Resistance - 1 x daily - 5 x weekly - 3 sets - 10 reps            Therapeutic Procedures         Total TIMED Treatment (min)   30         Manual therapy   -Proprioceptive Neuromuscular Facilitation.  Slow Reversals    - Glenohumeral External Rotation Repated Contractions agonist reversals ,   - Proprioceptive Neuromuscular Facilitation.  Slow Reversals   - Glenohumeral mobilizations   -           Total TIMED Treatment (min)   30         Therapeutic exercise    - 3# sidelying Glenohumeral 3 x 15   - Y,T Ws prone                       Physical Therapy Documentation     Row Name 06/10/20 1400          Goal 1 (Short Term)  Impairment Education need     Education need Patient able to return demonstrate Home Exercise Program independently to enable patient to achieve stated functional goals     Number of visits 3-5     Goal Status Continue     Row Name 06/10/20 1400          Goal 2 (Short Term)    Impairment Range of motion limitations      Custom goal L=R     Number of visits 10-14     Goal Status Continue     Row Name 06/10/20 1400          Goal 3 (Short Term)    Impairment Strength limitations     Custom goal 5-/5     Number of visits 10-14     Goal Status Continue     Row Name 06/10/20 1400          GOAL (Long Term 1)    Long Term Goal --  QDASH     Long Term Custom Goal Patient will demonstrate at least a 15.9 point improvement on the shortened version of Disabilities of the Arm, Shoulder and Hand (QuickDASH), demonstrating a significant change and improvement in their ability to perform functional, meaningful, and daily tasks along with demonstrating a general improvement of symptoms.  Darla Lesches & [place], [place] & Howell Pringle & Sartorio, Reyne Dumas & Bravini, Elisabetta & Verner Mould. (2013). Minimal Clinically Important Difference of the Disabilities of the Arm, Shoulder and Hand Outcome Measure (DASH) and Its Shortened Version (QuickDASH). The Journal of orthopaedic and sports physical therapy. 44. [email or web address]/jospt.[email or web address]     Long Term Goal Number of visits 10-14     Long Term Goal Status Continue     Row Name 06/10/20 1400          Outpatient Treatment Plan    Continue therapy to address  Activity tolerance limitation     OP Treatment Frequency 2 times per week     OP Treatment Duration 12 weeks     OP Status of treatment Patient evaluated and will benefit from ongoing skilled therapy     Row Name 06/10/20 1400          Outpatient Therapy Overview    Start of Care 04/15/20     Onset date  2.14.22     Row Name 06/10/20 1400          Therapeutic Procedures    Manual therapy (97140)  --  manual        Total TIMED Treatment (min)  30     Therapeutic exercise  (97110)  --  therex        Total TIMED Treatment (min)  30     Row Name 06/10/20 1400          Treatment Time     Total TIMED Treatment  (min) 60     Total Treatment Time (min) 60

## 2020-06-17 ENCOUNTER — Ambulatory Visit (INDEPENDENT_AMBULATORY_CARE_PROVIDER_SITE_OTHER): Payer: BC Managed Care – PPO | Admitting: Rehabilitative and Restorative Service Providers"

## 2020-06-24 ENCOUNTER — Ambulatory Visit (INDEPENDENT_AMBULATORY_CARE_PROVIDER_SITE_OTHER): Payer: BC Managed Care – PPO | Admitting: Rehabilitative and Restorative Service Providers"

## 2020-06-24 DIAGNOSIS — M25312 Other instability, left shoulder: Secondary | ICD-10-CM

## 2020-06-24 NOTE — Interdisciplinary (Signed)
PHYSICAL THERAPY DAILY TREATMENT NOTE    Referring Physician: Fredia Sorrow    Visit number: 6  Visits remaining on current referral: 8 out of 12    Current Referral Expiration Date: 03/26/2021    Insurance: BLUE CROSS    No past medical history on file.    Past Surgical History:   Procedure Laterality Date    INCISION AND DRAINAGE DEEP NECK ABSCESS Right     2 yoa       Allergies: Polytrim [polymyxin b-trimethoprim]  Current Outpatient Medications   Medication Sig Dispense Refill    docusate sodium (COLACE) 250 MG capsule Take 1 capsule (250 mg) by mouth 2 times daily. 20 capsule 0    famotidine (PEPCID) 20 MG tablet Take 1 tablet (20 mg) by mouth 2 times daily. 60 tablet 0    magnesium citrate 1.745 GM/30ML SOLN Take 100 mL by mouth daily. 195 mL 1    naproxen (NAPROSYN) 500 MG tablet Take 1 tablet (500 mg) by mouth 2 times daily (with meals). 40 tablet 0    ondansetron (ZOFRAN ODT) 4 MG disintegrating tablet Take 1 tablet (4 mg) on or under the tongue every 8 hours as needed for Nausea/Vomiting. 12 tablet 0    oxyCODONE-acetaminophen (PERCOCET) 5-325 MG tablet Take 1 tablet by mouth every 4 hours as needed for Severe Pain (Pain Score 7-10). 40 tablet 0    polyethylene glycol (GLYCOLAX) 17 GM/SCOOP powder Take 17 g by mouth daily. Mix with 4 to 8 ounces of fluid (water, juice, soda, coffee, or tea) prior to administration. 255 g 0    senna (SENOKOT) 8.6 MG tablet Take 1 tablet (8.6 mg) by mouth daily. 30 tablet 0     No current facility-administered medications for this visit.       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 Per Patient pain is 0/10 he notes he does have some  soreness. Patient able to recall  Doing T Y with 5# per patient  .  He notes continuing workout on assault bike, rowing erg, and sled pull and pushes and versa climber , battle ropes. Incline chest press 20# . He notes still going to [facility] for cupping and shoulder strength . Of note reports to football July 18th.  He notes doing gher strength x 2 per week.       OBJECTIVE:         Shoulder Active Range of Motion     Left ROM (degrees) Right ROM (degrees)   Flexion 160 160   Abduction 160 160   Apley IR Thoracolumbar junction  Thoracolumbar junction   Apley ER Thoracic Spinous Process level 2 Thoracic Spinous Process level 2    *=Pain          Shoulder Passive Range of Motion      LeftROM (degrees) RightROM (degrees)   Flexion 160    GH ER 110 90   GH IR 60    *=Pain      Shoulder Manual Muscle Testing (MMT)  Left Strength Right Strength   Abduction 4+/5 5+/5   Flexion 4/5 5/5   GH ER 4-/4  /5 5/5   GH  IR 5/5 5/5   Scaption 4+/5 5/5               ASSESSMENT:  Jorden is overall improving as noted in objective findings with improving strength form previous visit  .   Patient continues to present with Glenohumeral External Rotation weakness and Glenohumeral flexion weakness and ned for more consistency with workouts for shoulder stability  as evidence of need for continued skilled care to address impairments to reach goals set in plan of care and to return to previous level of function.    Exercises remain appropriately challenging and progressions are made as tolerated. Patient encouraged to remain adherent to their home exercise program as tolerated.Patient was educated on and shown how to perform each exercise safely and effectively 1 on 1.  Patient then performed and demonstrated each exercise. Patient was educated on importance of each exercise as it relates to their findings.  Patient's HEP (home exercise plan) was printed and handed to them today.  Instructions were  provided on their HEP printout regarding repetitions, sets, and frequency.       Plan:       Continue therapy to address : Activity tolerance limitation     Recommend continued visit 1 time per week  x duration of 12 weeks     - patient education on increase strength work for Glenohumeral External Rotation and Glenohumeral flexion 3-4 x per week         Home exercise program:      Access Code: [email or web address]  URL: [email or web address]  Date: 06/24/2020  Prepared by: Judie Grieve    Exercises  Shoulder Extension with Resistance - 1 x daily - 5 x weekly - 3 sets - 10 reps  Standing Bilateral Low Shoulder Row with Anchored Resistance - 1 x daily - 5 x weekly - 3 sets - 10 reps  Sidelying Shoulder ER with Towel and Dumbbell - 1 x daily - 5 x weekly - 3 sets - 10 reps  Prone W Scapular Retraction - 1 x daily - 5 x weekly - 3 sets - 10 reps  Prone Scapular Retraction Y - 1 x daily - 5 x weekly - 3 sets - 10 reps  Prone Shoulder Horizontal Abduction with Thumbs Up - 1 x daily - 5 x weekly - 3 sets - 10 reps        Therapeutic Procedures         Total TIMED Treatment (min)   30         Manual therapy   - Glenohumeral External Rotation multi angle , Repated Contractions , agonist reversals   - Proprioceptive Neuromuscular Facilitation.  D2 Repated Contractions agonist reversals   - Glenohumeral mobilizations           Total TIMED Treatment (min)   30         Therapeutic exercise    -Y.T Ws, 5# 3 x 10   - sidleying Glenohumeral External Rotation  3# 3 x 15   - Proprioceptive Neuromuscular Facilitation.  D 2 with Theraband          PHYSICAL THERAPY PRESCRIPTION:  LEFT SHOULDER POSTERIOR  ARTHROSCOPIC STABILIZATION WITH ILIAC CREST BONE GRAFT (L)     RECOVERY / RECUPERATION PHASE:  WEEKS 1 - 4              *  Immobilization in sling x 4 weeks except for exercises              * Elbow A/AAROM: flexion and extension.  * Protect POSTERIOR capsule from stretch. Limit IR to neutral, Horiz ABD, to scapular plane.              *  Modalities (i.e. CryoCuff) PRN.              * Wrist and gripping exercises.  * Deltoid isometrics.              * Grip strengthening              *Light soft tissue for L iliac crest and shoulder.  Gentle hip and core program  WEEKS 4 - 6              * At 4 weeks PROM: pulley for flexion, pendulum exercises.              * Pool exercises: A/AAROM flexion, extension, horiz. addctn, elbow flex & extension.              * Deltoid isometrics.              * Lightly resisted elbow flexion.              *Scapular and cuff isometrics              * Continue with wrist ex.              * Modalities PRN.              * Discontinue sling at 6 weeks; may be part time at 4 weeks  WEEKS 6 - 12              * 6-10 weeks, gradual A/AA/PROM to improve ER with arm at side.              * Progress flexion to tolerance.              * 10-12 weeks, A/AA/PROM to improve IR/ER              * Pool exercises AROM all directions below horizontal, light resisted motions in all planes.              * AROM activities to restore flexion, IR, ER, horiz ADD.              * Deltoid, Rotator Cuff isometrics progressing to isotonics.              * PREs for scapular muscles, latissimus, biceps, triceps.              * PREs work rotators in isolation (use modified neutral).              * Joint mobilization (posterior glides).              * Emphasize posterior cuff, latissimus, & scapular muscle strengthen, stress eccentrics.              * Utilize exercise arcs that protect anterior capsule from stress during PREs.              * Keep all strength exercises below the horizontal plane in this phase.  WEEKS 12 - 16                * AROM activities to restore full ROM.              *  Restore scapulohumeral rhythm.              * Joint mobilization.              * Aggressive scapular stabilization and eccentric strengthening program.              * PREs for all upper quarter musculature (begin to integrate upper extremity        patterns).  Continue to emphasize eccentrics and glenohumeral stabilization. All PREs       are  below the horizontal plane for non-throwers.              * Begin isokinetics.  WEEKS 16 - 24                * Begin muscle endurance activities (UBE).              * Continue with agility exercises.              * Advanced functional exercises.              * Isokinetic test.              * Functional test assessment.              * Full return to sporting activities.               Physical Therapy Documentation     Row Name 06/24/20 1300          Goal 1 (Short Term)    Impairment Education need     Education need Patient able to return demonstrate Home Exercise Program independently to enable patient to achieve stated functional goals     Number of visits 3-5     Goal Status Continue     Row Name 06/24/20 1300          Goal 2 (Short Term)    Impairment Range of motion limitations     Custom goal L=R     Number of visits 10-14     Goal Status Continue     Row Name 06/24/20 1300          Goal 3 (Short Term)    Impairment Strength limitations     Custom goal 5-/5     Number of visits 10-14     Goal Status Continue     Row Name 06/24/20 1300          GOAL (Long Term 1)    Long Term Goal --  QDASH     Long Term Custom Goal Patient will demonstrate at least a 15.9 point improvement on the shortened version of Disabilities of the Arm, Shoulder and Hand (QuickDASH), demonstrating a significant change and improvement in their ability to perform functional, meaningful, and daily tasks along with demonstrating a general improvement of symptoms.  Darla LeschesFranchignoni, [place] & [place], [place] & Howell PringleGiordano, Andrea & Sartorio, Reyne DumasFrancesco & Bravini, Elisabetta & Verner MouldFerriero, Giorgio. (2013). Minimal Clinically Important Difference of the Disabilities of the Arm, Shoulder and Hand Outcome Measure (DASH) and Its Shortened Version (QuickDASH). The Journal of orthopaedic and sports physical therapy. 44. [email or web address]     Long Term Goal Number of  visits 10-14     Long Term Goal Status Continue     Row Name 06/24/20 1300          Outpatient Treatment Plan    Continue therapy to address  Activity tolerance limitation     OP Treatment  Frequency 2 times per week     OP Treatment Duration 12 weeks     OP Status of treatment Patient evaluated and will benefit from ongoing skilled therapy     Row Name 06/24/20 1300          Outpatient Therapy Overview    Start of Care 04/15/20     Onset date  2.14.22     Row Name 06/24/20 1300          Therapeutic Procedures    Manual therapy (97140)  --  manual        Total TIMED Treatment (min)  30     Therapeutic exercise  (97110)  --  therex        Total TIMED Treatment (min)  30     Row Name 06/24/20 1300          Treatment Time     Total TIMED Treatment  (min) 60     Total Treatment Time (min) 60

## 2020-06-30 ENCOUNTER — Other Ambulatory Visit (INDEPENDENT_AMBULATORY_CARE_PROVIDER_SITE_OTHER)
Admit: 2020-06-30 | Discharge: 2020-06-30 | Disposition: A | Discharge status: Home Health | Payer: BC Managed Care – PPO

## 2020-06-30 ENCOUNTER — Ambulatory Visit (INDEPENDENT_AMBULATORY_CARE_PROVIDER_SITE_OTHER): Payer: BC Managed Care – PPO | Admitting: Sports Medicine

## 2020-06-30 ENCOUNTER — Encounter (INDEPENDENT_AMBULATORY_CARE_PROVIDER_SITE_OTHER): Payer: Self-pay | Admitting: Sports Medicine

## 2020-06-30 DIAGNOSIS — M25312 Other instability, left shoulder: Secondary | ICD-10-CM

## 2020-06-30 DIAGNOSIS — M25552 Pain in left hip: Secondary | ICD-10-CM

## 2020-06-30 NOTE — Progress Notes (Signed)
[facility] FOLLOWUP    PRIMARY CARE PROVIDER:   Brock Ra    PROCEDURE:  1. Left shoulder arthroscopy, diagnostic;  2. 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   2. Left shoulder posterior labral repair and capsulorrhaphy.  3. Left iliac crest bone graft harvest.    DATE OF SURGERY:03/31/2020     HISTORY: Dennis Morales is a 20 year old male  who presents today for post operative evaluation of the left shoulder, now 3 months s/p the above procedure. He is doing well with minimal pain. He is attending PT 2x per week. They are primarily working on strengthening at this time. He feels he has obtaining full ROM. He has no concerns at this time and is very pleased with his recovery. His ultimate goal is return to football and he hopes to play division 1 football next year.    Patient separately reports that he suffered a left groin strain and has had persistent pain despite conservative management.    Past medical history, medications, allergies, review of systems are unchanged since the previous visit.    PHYSICAL EXAM:  VITALS: There were no vitals taken for this visit., There is no height or weight on file to calculate BMI.,  GENERAL: A+Ox3. INAD. Well appearing and well groomed. Normal gait.  MENTAL STATUS: Pleasant and cooperative. Alert and oriented x3 with normal mood and affect.  Vascular: Pulses are palpable 2+, capillary refills to digits are normal.  Skin: No wounds/lesions/ulcers. Skin warm & dry.  Respiratory: No respiratory distress.    UPPER EXTREMITY:   Normal posture.  Shoulder: No swelling.  Incisions are healing well.  Range of Motion: Forward Flexion 180/180. External Rotation 60/60. IR L1/L1.  Strength: 5/5 supraspinatus, 5/5 subscapularis, 5-/5 infraspinatus  Sensation intact to light touch in the bilateral upper extremities.  2+ radial pulses.    Left hip:   Has intact and painless range of  motion.    He is nontender over the rectus.    He has primarily tender over the muscle tendon junction of adductor and just anterior to this.    He has mild pain and weakness with resisted adductor testing.  No pain and good strength with resisted flexion.  ==========    IMAGING: Left shoulder radiographs demonstrate left posterior glenoid bone graft with interval healing. Alignment appears somewhat improved from prior radiographs, but axillary view continues to demonstrate mild gapping. Unclear if this is true or projectional.    X-rays of the left hip are normal.  ==========    ASSESSMENT & PLAN: Dennis Morales is a 20 year old male now 3 months s/p left iliac crest bone graft for posterior labral bony deficiency. He is doing well and recovering appropriately. He has regained full ROM. He continues to work on strengthening with his physical therapist. Radiographs today demonstrate interval healing of bone graft, but continued irregularity on axillary view. He is scheduled to leave this summer for school. He will be playing football in [place]. Given this, we will obtain a CT scan of the right shoulder in June to ensure the graft is stable and well-aligned. He will follow-up in clinic in 6 weeks.     Patient also has concerns for left groin strain, which continues to cause discomfort. We have ordered radiographs in clinic. We have also ordered an MRI, which will be obtained if his symptoms persist, as we would like to ensure  full adequate recovery and no other derangement prior to him leaving for school and football  .  -Patient verbalizes understanding of all discussions today, and all questions were answered satisfactorily.    I saw the patient, performed a physical exam, and developed the treatment plan.    I edited the note and agree with the content as written.    See PA/resident note for full detail.

## 2020-07-04 ENCOUNTER — Ambulatory Visit (INDEPENDENT_AMBULATORY_CARE_PROVIDER_SITE_OTHER): Payer: BC Managed Care – PPO | Admitting: Rehabilitative and Restorative Service Providers"

## 2020-07-08 ENCOUNTER — Ambulatory Visit (INDEPENDENT_AMBULATORY_CARE_PROVIDER_SITE_OTHER): Payer: BC Managed Care – PPO | Admitting: Rehabilitative and Restorative Service Providers"

## 2020-07-08 DIAGNOSIS — M25312 Other instability, left shoulder: Secondary | ICD-10-CM

## 2020-07-08 NOTE — Interdisciplinary (Signed)
PHYSICAL THERAPY DAILY TREATMENT NOTE    Referring Physician: Fredia Sorrow    Visit number:   Visits remaining on current referral: 6 out of 12    Current Referral Expiration Date: 03/26/2021    Insurance: BLUE CROSS    No past medical history on file.    Past Surgical History:   Procedure Laterality Date    INCISION AND DRAINAGE DEEP NECK ABSCESS Right     2 yoa       Allergies: Polytrim [polymyxin b-trimethoprim]  Current Outpatient Medications   Medication Sig Dispense Refill    docusate sodium (COLACE) 250 MG capsule Take 1 capsule (250 mg) by mouth 2 times daily. 20 capsule 0    famotidine (PEPCID) 20 MG tablet Take 1 tablet (20 mg) by mouth 2 times daily. 60 tablet 0    magnesium citrate 1.745 GM/30ML SOLN Take 100 mL by mouth daily. 195 mL 1    naproxen (NAPROSYN) 500 MG tablet Take 1 tablet (500 mg) by mouth 2 times daily (with meals). 40 tablet 0    ondansetron (ZOFRAN ODT) 4 MG disintegrating tablet Take 1 tablet (4 mg) on or under the tongue every 8 hours as needed for Nausea/Vomiting. 12 tablet 0    oxyCODONE-acetaminophen (PERCOCET) 5-325 MG tablet Take 1 tablet by mouth every 4 hours as needed for Severe Pain (Pain Score 7-10). 40 tablet 0    polyethylene glycol (GLYCOLAX) 17 GM/SCOOP powder Take 17 g by mouth daily. Mix with 4 to 8 ounces of fluid (water, juice, soda, coffee, or tea) prior to administration. 255 g 0    senna (SENOKOT) 8.6 MG tablet Take 1 tablet (8.6 mg) by mouth daily. 30 tablet 0     No current facility-administered medications for this visit.       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  . Per Patient pain is 0/10. Patient able to recall home exercise program    for lift with performance coach doing:  -  shoulder taps,   - overhead  Press with barbell   - push ups   -  Bicep curls, tricep press down with band.   - bent over row single arm   - bench press 95#  , 115# x 10 , 135# x 10 , 155# x 8        OBJECTIVE:      Shoulder Passive Range of Motion      LeftROM (degrees) RightROM (degrees)   Flexion 160    GH ER 110 90   GH IR 60    *=Pain      Shoulder Manual Muscle Testing (MMT)     Left Strength RightStrength   Abduction 4+/5 5+/5   Flexion 4/5 5/5   GH ER 4 /5 5/5   GH IR 5/5 5/5   Scaption 4+/5 5/5             ASSESSMENT:  Dennis Morales is overall improving as noted in objective findings with Upper Extremity strength and Glenohumeral External Rotation  .   Patient continues to present with Left Upper Extremity instability  as evidence of need for continued skilled care to address impairments to reach goals set in plan of care and to return to previous level of function.    Exercises remain appropriately challenging and progressions are made as tolerated. Patient encouraged to remain adherent to their home exercise program as tolerated.Patient was educated on and shown how to perform each exercise safely and effectively 1 on 1.  Patient then performed and demonstrated each exercise. Patient was educated on importance of each exercise as it relates to their findings.  Patient's HEP (home exercise plan) was printed and handed to them today.  Instructions were provided on their HEP printout regarding repetitions, sets, and frequency.       Plan:       Continue therapy to address : Activity tolerance limitation     Recommend continued visit 1 time per week  x duration of 12 weeks     - moves to [facility] July 18th for football camp.   - scapula stabilization,    shoulder taps ,       Home exercise program:  Access Code: [email or web address]  URL: [name]  Date: 06/24/2020  Prepared by: Judie Grieve    Exercises  Shoulder Extension with Resistance - 1 x daily - 5 x weekly - 3 sets - 10 reps  Standing  Bilateral Low Shoulder Row with Anchored Resistance - 1 x daily - 5 x weekly - 3 sets - 10 reps  Sidelying Shoulder ER with Towel and Dumbbell - 1 x daily - 5 x weekly - 3 sets - 10 reps  Prone W Scapular Retraction - 1 x daily - 5 x weekly - 3 sets - 10 reps  Prone Scapular Retraction Y - 1 x daily - 5 x weekly - 3 sets - 10 reps  Prone Shoulder Horizontal Abduction with Thumbs Up - 1 x daily - 5 x weekly - 3 sets - 10 reps            Therapeutic Procedures         Total TIMED Treatment (min)   30         Manual therapy   -Proprioceptive Neuromuscular Facilitation.  Slow Reversals D2   - GH External Rotation Repated Contractions agonist reversals   - Glenohumeral mobilizations             Total TIMED Treatment (min)  30         Therapeutic exercise    --Y.T Ws, 5# 3 x 10   - sidleying Glenohumeral External Rotation  3# 3 x 15  - grey Theraband , Glenohumeral flexion, Abduction, Extension, 3 x 10 bilateral Upper Extremity 3x 10   - bosu plank.                       Physical Therapy Documentation     Row Name 07/08/20 1200          Goal 1 (Short Term)    Impairment Education need     Education need Patient able to return demonstrate Home Exercise Program independently to enable patient to achieve stated functional goals     Number of visits 3-5     Goal Status Continue     Row Name 07/08/20 1200          Goal 2 (Short Term)    Impairment Range of motion limitations     Custom goal L=R     Number of visits 10-14  Goal Status Continue     Row Name 07/08/20 1200          Goal 3 (Short Term)    Impairment Strength limitations     Custom goal 5-/5     Number of visits 10-14     Goal Status Continue     Row Name 07/08/20 1200          GOAL (Long Term 1)    Long Term Goal --  QDASH     Long Term Custom Goal Patient will demonstrate at least a 15.9 point improvement on the shortened version of Disabilities of the Arm, Shoulder and Hand (QuickDASH), demonstrating a significant change and improvement in their ability to  perform functional, meaningful, and daily tasks along with demonstrating a general improvement of symptoms.  Darla Lesches & [place], [place] & Howell Pringle & Sartorio, Reyne Dumas & Bravini, Elisabetta & Verner Mould. (2013). Minimal Clinically Important Difference of the Disabilities of the Arm, Shoulder and Hand Outcome Measure (DASH) and Its Shortened Version (QuickDASH). The Journal of orthopaedic and sports physical therapy. 44. 10.2519/jospt.[email or web address]     Long Term Goal Number of visits 10-14     Long Term Goal Status Continue     Row Name 07/08/20 1200          Outpatient Treatment Plan    Continue therapy to address  Activity tolerance limitation     OP Treatment Frequency 2 times per week     OP Treatment Duration 12 weeks     OP Status of treatment Patient evaluated and will benefit from ongoing skilled therapy     Row Name 07/08/20 1200          Outpatient Therapy Overview    Start of Care 04/15/20     Onset date  2.14.22     Row Name 07/08/20 1200          Therapeutic Procedures    Manual therapy (97140)  --  manual        Total TIMED Treatment (min)  30     Therapeutic exercise  (97110)  --  therex        Total TIMED Treatment (min)  30     Row Name 07/08/20 1200          Treatment Time     Total TIMED Treatment  (min) 60     Total Treatment Time (min) 60

## 2020-07-22 ENCOUNTER — Ambulatory Visit (INDEPENDENT_AMBULATORY_CARE_PROVIDER_SITE_OTHER): Payer: BC Managed Care – PPO | Admitting: Rehabilitative and Restorative Service Providers"

## 2020-08-01 ENCOUNTER — Ambulatory Visit (INDEPENDENT_AMBULATORY_CARE_PROVIDER_SITE_OTHER): Payer: BC Managed Care – PPO | Admitting: Rehabilitative and Restorative Service Providers"

## 2020-08-01 ENCOUNTER — Encounter (INDEPENDENT_AMBULATORY_CARE_PROVIDER_SITE_OTHER): Payer: Self-pay | Admitting: Rehabilitative and Restorative Service Providers"

## 2020-08-01 NOTE — Interdisciplinary (Signed)
Richardean Chimera, did not show for scheduled physical therapy appointment , 08/01/2020.      [name] PT, DPT, OCS, SCS, ATC

## 2020-08-05 ENCOUNTER — Ambulatory Visit (INDEPENDENT_AMBULATORY_CARE_PROVIDER_SITE_OTHER): Payer: PRIVATE HEALTH INSURANCE | Admitting: Rehabilitative and Restorative Service Providers"

## 2020-08-05 DIAGNOSIS — M25312 Other instability, left shoulder: Secondary | ICD-10-CM

## 2020-08-05 NOTE — Interdisciplinary (Signed)
PHYSICAL THERAPY DAILY TREATMENT NOTE    Referring Physician: Fredia Sorrow    Visit number: 9  Visits remaining on current referral: 6 out of 12    Current Referral Expiration Date: 03/26/2021    Insurance: BLUE CROSS    No past medical history on file.    Past Surgical History:   Procedure Laterality Date    INCISION AND DRAINAGE DEEP NECK ABSCESS Right     2 yoa       Allergies: Polytrim [polymyxin b-trimethoprim]  Current Outpatient Medications   Medication Sig Dispense Refill    docusate sodium (COLACE) 250 MG capsule Take 1 capsule (250 mg) by mouth 2 times daily. 20 capsule 0    famotidine (PEPCID) 20 MG tablet Take 1 tablet (20 mg) by mouth 2 times daily. 60 tablet 0    magnesium citrate 1.745 GM/30ML SOLN Take 100 mL by mouth daily. 195 mL 1    naproxen (NAPROSYN) 500 MG tablet Take 1 tablet (500 mg) by mouth 2 times daily (with meals). 40 tablet 0    ondansetron (ZOFRAN ODT) 4 MG disintegrating tablet Take 1 tablet (4 mg) on or under the tongue every 8 hours as needed for Nausea/Vomiting. 12 tablet 0    oxyCODONE-acetaminophen (PERCOCET) 5-325 MG tablet Take 1 tablet by mouth every 4 hours as needed for Severe Pain (Pain Score 7-10). 40 tablet 0    polyethylene glycol (GLYCOLAX) 17 GM/SCOOP powder Take 17 g by mouth daily. Mix with 4 to 8 ounces of fluid (water, juice, soda, coffee, or tea) prior to administration. 255 g 0    senna (SENOKOT) 8.6 MG tablet Take 1 tablet (8.6 mg) by mouth daily. 30 tablet 0     No current facility-administered medications for this visit.       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 Per Patient pain is 0/10. Patient able to recall home exercise program.    .       OBJECTIVE:             Shoulder Manual  Muscle Testing (MMT)     Left Strength RightStrength   Abduction 5-/5 5+/5   Flexion 4+/5 5/5   GH ER 4/5 5/5   GH IR 5/5 5/5   Scaption 4+/5 5/5     Noted trapezius compensation with overhead  Press and Glenohumeral flexion Abduction, as evidence of noted Glenohumeral weakness and need for improved Glenohumeral stability   - noted significant fatigue with bear crawl.         ASSESSMENT:  Dwyne is overall improving as noted in objective findings with improved functional activities  .   Patient continues to present with Glenohumeral External Rotation weakness as evidence of need for continued skilled care to address impairments to reach goals set in plan of care and to return to previous level of function.    Exercises remain appropriately challenging and progressions are made as tolerated. Patient encouraged to remain adherent to their home exercise program as tolerated.Patient was educated on and shown how to perform each exercise safely and effectively  1 on 1.  Patient then performed and demonstrated each exercise. Patient was educated on importance of each exercise as it relates to their findings.  Patient's HEP (home exercise plan) was printed and handed to them today.  Instructions were provided on their HEP printout regarding repetitions, sets, and frequency.       Plan:       Continue therapy to address : Activity tolerance limitation     Recommend continued visit 1 time per week  x duration of 12 weeks         - patient will perform CT for healing follow up with Dr. Merilynn Finland and schedule appointment, July 18th for football.       Home exercise program:  Access Code: [email or web address]  URL: [email or web address]  Date: 06/24/2020  Prepared by: Judie Grieve    Exercises  Shoulder Extension with Resistance - 1 x daily - 5 x weekly - 3 sets - 10 reps  Standing Bilateral Low Shoulder Row with Anchored Resistance - 1 x daily - 5 x weekly - 3 sets - 10 reps  Sidelying Shoulder ER with Towel and Dumbbell - 1  x daily - 5 x weekly - 3 sets - 10 reps  Prone W Scapular Retraction - 1 x daily - 5 x weekly - 3 sets - 10 reps  Prone Scapular Retraction Y - 1 x daily - 5 x weekly - 3 sets - 10 reps  Prone Shoulder Horizontal Abduction with Thumbs Up - 1 x daily - 5 x weekly - 3 sets - 10 reps              Therapeutic Procedures         Total TIMED Treatment (min)   30         Manual therapy   -Proprioceptive Neuromuscular Facilitation.  Slow Reversals , Glenohumeral External Rotation Repated Contractions agonist reversals           Total TIMED Treatment (min)  30         Therapeutic exercise    -  - overhead  Press neutral grip 5#  3 x 10  - bear crawl x 5, blue Theraband around wrist 2 x 5    - arm walk with sliders.   - Theraband stability Glenohumeral flexion, Abduction, Glenohumeral Extension with green Theraband.                       Physical Therapy Documentation     Row Name 08/05/20 1200          Goal 1 (Short Term)    Impairment Education need     Education need Patient able to return demonstrate Home Exercise Program independently to enable patient to achieve stated functional goals     Number of visits 3-5     Goal Status Continue     Row Name 08/05/20 1200          Goal 2 (Short Term)    Impairment Range of motion limitations     Custom goal L=R     Number of visits 10-14     Goal Status Continue     Row Name 08/05/20 1200          Goal 3 (Short Term)    Impairment Strength limitations     Custom goal 5-/5     Number of visits 10-14     Goal Status Continue     Row Name 08/05/20 1200  GOAL (Long Term 1)    Long Term Goal --  QDASH     Long Term Custom Goal Patient will demonstrate at least a 15.9 point improvement on the shortened version of Disabilities of the Arm, Shoulder and Hand (QuickDASH), demonstrating a significant change and improvement in their ability to perform functional, meaningful, and daily tasks along with demonstrating a general improvement of symptoms.  Dennis Morales & [place],  [place] & Howell Pringle & Sartorio, Reyne Dumas & Bravini, Elisabetta & Verner Mould. (2013). Minimal Clinically Important Difference of the Disabilities of the Arm, Shoulder and Hand Outcome Measure (DASH) and Its Shortened Version (QuickDASH). The Journal of orthopaedic and sports physical therapy. 44. [email or web address]/jospt.[email or web address]     Long Term Goal Number of visits 10-14     Long Term Goal Status Continue     Row Name 08/05/20 1200          Outpatient Treatment Plan    Continue therapy to address  Activity tolerance limitation     OP Treatment Frequency 2 times per week     OP Treatment Duration 12 weeks     OP Status of treatment Patient evaluated and will benefit from ongoing skilled therapy     Row Name 08/05/20 1200          Outpatient Therapy Overview    Start of Care 04/15/20     Onset date  2.14.22     Row Name 08/05/20 1200          Therapeutic Procedures    Manual therapy (97140)  --  manual        Total TIMED Treatment (min)  30     Therapeutic exercise  (97110)  --  therex        Total TIMED Treatment (min)  30     Row Name 08/05/20 1200          Treatment Time     Total TIMED Treatment  (min) 60     Total Treatment Time (min) 60

## 2020-08-21 ENCOUNTER — Other Ambulatory Visit: Payer: Self-pay

## 2020-08-26 ENCOUNTER — Telehealth (INDEPENDENT_AMBULATORY_CARE_PROVIDER_SITE_OTHER): Payer: Self-pay | Admitting: Sports Medicine

## 2020-08-26 NOTE — Telephone Encounter (Signed)
Insurance denied hip MRI.   Called pt to discuss. He reports the hip "is doing way better" he is able to sprint and feels his symptoms are improving. Pt feels MRI is not needed at this time.

## 2020-08-28 ENCOUNTER — Ambulatory Visit
Admission: RE | Admit: 2020-08-28 | Discharge: 2020-08-28 | Disposition: A | Discharge status: Home / Self Care | Payer: BC Managed Care – PPO | Attending: Sports Medicine | Admitting: Sports Medicine

## 2020-08-28 DIAGNOSIS — M25312 Other instability, left shoulder: Secondary | ICD-10-CM | POA: Insufficient documentation

## 2020-08-28 DIAGNOSIS — M25512 Pain in left shoulder: Secondary | ICD-10-CM

## 2020-08-29 ENCOUNTER — Ambulatory Visit (HOSPITAL_BASED_OUTPATIENT_CLINIC_OR_DEPARTMENT_OTHER): Payer: BC Managed Care – PPO

## 2020-08-29 ENCOUNTER — Encounter (INDEPENDENT_AMBULATORY_CARE_PROVIDER_SITE_OTHER): Payer: Self-pay | Admitting: Sports Medicine

## 2020-08-29 ENCOUNTER — Ambulatory Visit (INDEPENDENT_AMBULATORY_CARE_PROVIDER_SITE_OTHER): Payer: BC Managed Care – PPO | Admitting: Sports Medicine

## 2020-08-29 DIAGNOSIS — M25312 Other instability, left shoulder: Secondary | ICD-10-CM

## 2020-08-29 NOTE — Progress Notes (Signed)
[facility], SPORTS MEDICINE FOLLOWUP    PRIMARY CARE PROVIDER:   Brock Ra    PROCEDURE:  1. Left shoulder arthroscopy, diagnostic;  2. 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   2. Left shoulder posterior labral repair and capsulorrhaphy.  3. Left iliac crest bone graft harvest.    DATE OF SURGERY:03/31/2020     HISTORY: Dennis Morales is a 20 year old male  who presents today for post operative evaluation of the left shoulder, now 5 months s/p the above procedure. He is doing well with no pain.  He stopped going to physical therapy and June.  He is now doing workouts on his own at the gym.  He is pleased with his range of motion and his strength.  He is leaving at the end of the month for college and will play football    His previously reported hip pain has resolved    Past medical history, medications, allergies, review of systems are unchanged since the previous visit.    PHYSICAL EXAM:  VITALS: There were no vitals taken for this visit., There is no height or weight on file to calculate BMI.,  GENERAL: A+Ox3. INAD. Well appearing and well groomed. Normal gait.  MENTAL STATUS: Pleasant and cooperative. Alert and oriented x3 with normal mood and affect.  Vascular: Pulses are palpable 2+, capillary refills to digits are normal.  Skin: No wounds/lesions/ulcers. Skin warm & dry.  Respiratory: No respiratory distress.    UPPER EXTREMITY:   Normal posture.  LEFT Shoulder: No swelling.  Incisions are healed.  Range of Motion: Forward Flexion 180/180. External Rotation 60/60. IR L1/L1.  Strength: 5/5 supraspinatus, 5/5 subscapularis, 5-/5 infraspinatus  Normal stability exam  Sensation intact to light touch in the bilateral upper extremities.  2+ radial pulses.        ==========    IMAGING:  CT of the left shoulder demonstrates healing of the superior and inferior ends of the bone graft.  There was still some area of incomplete  healing/resorption in the central portion.  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 He is doing well and recovering appropriately. He has regained full ROM and continues to improve with his strength.  CT scan demonstrates healing of bone graft superiorly and inferiorly but incomplete healing centrally with maybe some resorption. He is scheduled to leave this summer for school. He will be playing football in [place].  He is doing overall well and can return to football training.  A note was provided for his athletic trainer.  I would like to see him have some modifications for the next 2 months.  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  I told him and his mother that I am happy to talk to their athletic trainer or team physician.  .  -Patient verbalizes understanding of all discussions today, and all questions were answered satisfactorily.    I saw the patient, performed a physical exam, and developed the treatment plan.    I edited the note and agree with the content as written.    See PA/resident note for full detail.

## 2020-08-29 NOTE — Patient Instructions (Signed)
[facility] Medical Records   Requests for medical records can be made online through [facility] or in writing.  Website:  [email or web address]  Phone: [phone]    Requests for Images  Please contact Radiology/Imaging Services at [phone]

## 2020-09-09 ENCOUNTER — Encounter (INDEPENDENT_AMBULATORY_CARE_PROVIDER_SITE_OTHER): Payer: Self-pay | Admitting: Hospital

## 2020-09-10 ENCOUNTER — Encounter (INDEPENDENT_AMBULATORY_CARE_PROVIDER_SITE_OTHER): Payer: Self-pay | Admitting: Hospital

## 2021-06-26 ENCOUNTER — Telehealth (INDEPENDENT_AMBULATORY_CARE_PROVIDER_SITE_OTHER): Admitting: Sports Medicine

## 2021-06-26 ENCOUNTER — Other Ambulatory Visit: Payer: Self-pay

## 2021-06-26 NOTE — Progress Notes (Signed)
---------------------(data below generated by Irine Seal, MD)--------------------    Patient Verification & Telemedicine Consent:    I am proceeding with this evaluation at the direct request of the patient.  I have verified this is the correct patient and have obtained verbal consent from the patient/ surrogate to perform this voluntary telemedicine evaluation (including obtaining history, performing examination and reviewing data provided by the patient).   The patient/ surrogate has the right to refuse this evaluation.  I have explained risks (including potential loss of confidentiality), benefits, alternatives, and the potential need for subsequent face to face care. Patient/ surrogate understands that there is a risk of medical inaccuracies given that our recommendations will be made based on reported data (and we must therefore assume this information is accurate).  Knowing that there is a risk that this information is not reported accurately, and that the telemedicine video, audio, or data feed may be incomplete, the patient agrees to proceed with evaluation and holds [place] harmless knowing these risks. In this evaluation, we will be providing recommendations only.  The ultimate decision to follow, or not follow, these recommendations will be left to the bedside treating/ requesting practitioner.  The patient/ surrogate has been notified that other healthcare professionals (including students, residents and Metallurgist) may be involved in this audio-video evaluation.   All laws concerning confidentiality and patient access to medical records and copies of medical records apply to telemedicine. The patient/ surrogate has received the MyChart Notice of Privacy Practices. Executive order from the [employer] as of May 02 2018 suspends the patient consent requirement for telehealth services and administrative fines, penalties, and civil causes of action pursuant to [employer]. It also reduces burdens on physicians by suspending or extending timelines with regard to various breach notification requirements. These flexibilities apply to telehealth provided for any reason, regardless of whether the telehealth service is related to the diagnosis and treatment of health conditions related to COVID-19. If the patient is not capacitated to understand the above, and no surrogate is available, since this is not an emergency evaluation, the visit will be rescheduled until such time that the patient can consent, or the surrogate is available to consent.    Demographics:   Medical Record #: [id number]   Date: Jun 26, 2021   Patient Name: Dennis Morales   DOB: 22-Aug-2000  Age: 21 year old  Sex: male  Location: Home address on file    Evaluator(s):   Maichael Hemme was evaluated by me today.      Dennis  Dean Morales is a 21 year old male who presents for No chief complaint on file.    - R stye x 1 week. No visual changes. Mildly tender. No other treatments used.    Review of systems:    See HPI. Otherwise, patient did not endorse other symptoms.     Medications:    Current Outpatient Medications on File Prior to Visit   Medication Sig Dispense Refill   . docusate sodium (COLACE) 250 MG capsule Take 1 capsule (250 mg) by mouth 2 times daily. 20 capsule 0   . famotidine (PEPCID) 20 MG tablet Take 1 tablet (20 mg) by mouth 2 times daily. 60 tablet 0   . magnesium citrate 1.745 GM/30ML SOLN Take 100 mL by mouth daily. 195 mL 1   . naproxen (NAPROSYN) 500 MG tablet Take 1 tablet (500 mg)  by mouth 2 times daily (with meals). 40 tablet 0   . ondansetron (ZOFRAN ODT) 4 MG disintegrating tablet Take 1 tablet (4 mg) on or under the tongue every 8 hours as needed for Nausea/Vomiting. 12 tablet 0   . oxyCODONE-acetaminophen (PERCOCET) 5-325 MG tablet Take 1 tablet by mouth every 4 hours as needed for Severe Pain (Pain Score 7-10).  40 tablet 0   . polyethylene glycol (GLYCOLAX) 17 GM/SCOOP powder Take 17 g by mouth daily. Mix with 4 to 8 ounces of fluid (water, juice, soda, coffee, or tea) prior to administration. 255 g 0   . senna (SENOKOT) 8.6 MG tablet Take 1 tablet (8.6 mg) by mouth daily. 30 tablet 0     No current facility-administered medications on file prior to visit.       Medical History:    Patient Active Problem List   Diagnosis   . Eczema   . Injury of shoulder, left, initial encounter       Surgical History:    Past Surgical History:   Procedure Laterality Date   . INCISION AND DRAINAGE DEEP NECK ABSCESS Right     2 yoa       Social History:    Social History     Socioeconomic History   . Marital status: Single     Spouse name: Not on file   . Number of children: Not on file   . Years of education: Not on file   . Highest education level: Not on file   Occupational History   . Not on file   Tobacco Use   . Smoking status: Never   . Smokeless tobacco: Never   Vaping Use   . Vaping status: Not on file   Substance and Sexual Activity   . Alcohol use: No   . Drug use: No   . Sexual activity: Never   Other Topics Concern   . Military Service Not Asked   . Blood Transfusions Not Asked   . Caffeine Concern No   . Occupational Exposure Not Asked   . Hobby Hazards Not Asked   . Sleep Concern No     Comment: 5-6 hrs/night   . Stress Concern No     Comment: None   . Weight Concern Not Asked   . Special Diet No     Comment: Regular   . Back Care Not Asked   . Exercises Regularly Yes     Comment: Football, weight training x4-5 days/wk   . Bike Helmet Use Not Asked   . Seat Belt Use Not Asked   . Performs Self-Exams Not Asked   Social History Narrative    02/2019: school in [place] Plays football, running back.     Social Determinants of Health     Financial Resource Strain: Not on file   Food Insecurity: Not on file   Transportation Needs: Not on file   Physical Activity: Not on file   Stress: Not on file   Social Connections: Not on file    Intimate Partner Violence: Not on file   Housing Stability: Not on file       OBJECTIVE    General Assessment:  alert, speaking in full sentences and intelligibly without signs of distress  Head:  atraumatic  Eyes:  conjunctiva appear clear, eye movements normal with conversation. Chalazion present R upper eyelid  Mouth:  normal in appearance  Neck:  normal motion with speaking, moving, no visible masses or lymphadenopathy  Lungs/Cardiopulmonary:  speaking in full sentences, no audible wheezes/respiratory sounds, breathing is comfortable and normal rate  Extr:  no visible edema or cyanosis  Skin:  exposed/visualized portions of skin appear free of rashes or lesions    ASSESSMENT/PLAN  Cordai Litterer is a 21 year old male presenting with chalazion:      ICD-10-CM ICD-9-CM    1. Chalazion of right upper eyelid  H00.11 373.2       - Hot compresses multiple times per day. RTC PRN    Follow up: No follow-ups on file.     There are no Patient Instructions on file for this visit.     Patient Care Management:  Plan discussed with pt including risks/benefits/alternatives including watchful waiting.  Informed pt of 24/7 on call MD.  ED if acutely worsening after hours.  Pt verbalized understanding.    Medications reviewed with patient and medication list reconciled.  Over the counter medications, herbal therapies and supplements reviewed.  Patient's understanding and response to medications assessed.    Barriers to medications assessed and addressed.  Risks, benefits, alternatives to medications reviewed.  F/U:  See instructions    Irine Seal, MD  [facility] Medicine Physician  [place], [facility]  Friday Jun 26, 2021  3:06 PM

## 2021-06-29 ENCOUNTER — Encounter (INDEPENDENT_AMBULATORY_CARE_PROVIDER_SITE_OTHER): Admitting: Sports Medicine

## 2021-06-29 NOTE — Progress Notes (Deleted)
[facility] and [facility]   Primary Care Sports Medicine    History of Present Illness:: Jessup Ogas is a 21 year old male who presents for following:  Chief Complaint: No chief complaint on file.  here with mother, Alaina.     Skin lesion on chin  - ***  - MyChart Video Visit with Dr. Gretta Arab on 06/26/2021 for right upper eyelid swelling    Patient Active Problem List   Diagnosis   . Eczema   . Injury of shoulder, left, initial encounter     No past medical history on file.  Past Surgical History:   Procedure Laterality Date   . INCISION AND DRAINAGE DEEP NECK ABSCESS Right     2 yoa     Current Outpatient Medications on File Prior to Visit   Medication Sig Dispense Refill   . docusate sodium (COLACE) 250 MG capsule Take 1 capsule (250 mg) by mouth 2 times daily. 20 capsule 0   . famotidine (PEPCID) 20 MG tablet Take 1 tablet (20 mg) by mouth 2 times daily. 60 tablet 0   . magnesium citrate 1.745 GM/30ML SOLN Take 100 mL by mouth daily. 195 mL 1   . naproxen (NAPROSYN) 500 MG tablet Take 1 tablet (500 mg) by mouth 2 times daily (with meals). 40 tablet 0   . ondansetron (ZOFRAN ODT) 4 MG disintegrating tablet Take 1 tablet (4 mg) on or under the tongue every 8 hours as needed for Nausea/Vomiting. 12 tablet 0   . oxyCODONE-acetaminophen (PERCOCET) 5-325 MG tablet Take 1 tablet by mouth every 4 hours as needed for Severe Pain (Pain Score 7-10). 40 tablet 0   . polyethylene glycol (GLYCOLAX) 17 GM/SCOOP powder Take 17 g by mouth daily. Mix with 4 to 8 ounces of fluid (water, juice, soda, coffee, or tea) prior to administration. 255 g 0   . senna (SENOKOT) 8.6 MG tablet Take 1 tablet (8.6 mg) by mouth daily. 30 tablet 0     No current facility-administered medications on file prior to visit.     Allergies   Allergen Reactions   . Polytrim [Polymyxin B-Trimethoprim] Hives     Family History   Problem Relation Name Age of Onset   . No Known Heart Disease Mother     . No Known Heart Disease  Father         Review of systems:  See HPI. Otherwise, no other patient endorsed symptoms.      Physical Exam:  There were no vitals taken for this visit.  GENERAL: NAD, comfortable, WDWN  RESP:  Unlabored breathing  PSYCH: normal affect  NEURO:  alert & oriented  SKIN: Intact, no rashes or erythema, no open wounds  LYMPH:  No lymphadenopathy  MSK:  ***  ASSESSMENT AND PLAN:  No diagnosis found.    ***  Patient Instructions: see AVS.    Follow up: No follow-ups on file.     Medication Management   Medications reviewed with patient and medication list reconciled.   Over the counter medications, herbal therapies, and supplements reviewed.   Patient's understanding and response to medications assessed.   Barriers to medications assessed and addressed.   Risks, benefits, alternatives to medications reviewed.     Barriers to Learning   Assessed: none.  Patient verbalizes understanding of teaching and instructions.  Diagnosis and treatment options were discussed in detail with the patient who is in agreement with the plan. All questions were answered.  RTC/ED precautions discussed with patient.    Garner Gavel, M.D.  [facility] & [facility]  Monday 06/29/2021

## 2021-08-14 ENCOUNTER — Ambulatory Visit (INDEPENDENT_AMBULATORY_CARE_PROVIDER_SITE_OTHER): Admitting: Family

## 2021-08-14 ENCOUNTER — Encounter (INDEPENDENT_AMBULATORY_CARE_PROVIDER_SITE_OTHER): Payer: Self-pay | Admitting: Family

## 2021-08-14 VITALS — BP 109/57 | HR 74 | Temp 98.5°F | Ht 68.0 in | Wt 198.0 lb

## 2021-08-14 NOTE — Progress Notes (Signed)
Dennis Morales is a 21 year old male who presents for   Chief Complaint   Patient presents with   . Stye       History of Present Illness:  Right eye problem ( bump ) x 2 weeks Has been using OTC drops without improvement. Has not been doing warm soaks.     Patient Active Problem List   Diagnosis   . Eczema   . Injury of shoulder, left, initial encounter       No past medical history on file.    Past Surgical History:   Procedure Laterality Date   . INCISION AND DRAINAGE DEEP NECK ABSCESS Right     2 yoa       Current Outpatient Medications   Medication Sig Dispense Refill   . docusate sodium (COLACE) 250 MG capsule Take 1 capsule (250 mg) by mouth 2 times daily. 20 capsule 0   . famotidine (PEPCID) 20 MG tablet Take 1 tablet (20 mg) by mouth 2 times daily. 60 tablet 0   . magnesium citrate 1.745 GM/30ML SOLN Take 100 mL by mouth daily. 195 mL 1   . naproxen (NAPROSYN) 500 MG tablet Take 1 tablet (500 mg) by mouth 2 times daily (with meals). 40 tablet 0   . senna (SENOKOT) 8.6 MG tablet Take 1 tablet (8.6 mg) by mouth daily. 30 tablet 0     No current facility-administered medications for this visit.       Allergies   Allergen Reactions   . Polymyxin B-Trimethoprim Hives       Social History     Socioeconomic History   . Marital status: Single     Spouse name: Not on file   . Number of children: Not on file   . Years of education: Not on file   . Highest education level: Not on file   Occupational History   . Not on file   Tobacco Use   . Smoking status: Never   . Smokeless tobacco: Never   Substance and Sexual Activity   . Alcohol use: No   . Drug use: No   . Sexual activity: Never   Other Topics Concern   . Military Service Not Asked   . Blood Transfusions Not Asked   . Caffeine Concern No   . Occupational Exposure Not Asked   . Hobby Hazards Not Asked   . Sleep Concern No     Comment: 5-6 hrs/night   . Stress Concern No     Comment: None   . Weight Concern Not Asked   . Special Diet No     Comment: Regular   .  Back Care Not Asked   . Exercises Regularly Yes     Comment: Football, weight training x4-5 days/wk   . Bike Helmet Use Not Asked   . Seat Belt Use Not Asked   . Performs Self-Exams Not Asked   Social History Narrative    02/2019: school in [place] Plays football, running back.     Social Determinants of Health     Financial Resource Strain: Not on file   Food Insecurity: Not on file   Transportation Needs: Not on file   Physical Activity: Not on file   Stress: Not on file   Social Connections: Not on file   Intimate Partner Violence: Not on file   Housing Stability: Not on file       Family History   Problem Relation Name Age of Onset   .  No Known Heart Disease Mother     . No Known Heart Disease Father             Review of Systems     Gen: no fatigue, fever or chills  HEENT: no eye redness.   no pain   no  Discharge    no decrease in vision.  No Trauma.    PHYSICAL EXAMINATION:  BP (!) 109/57   Pulse 74   Temp 98.5 F (36.9 C) (Tympanic)   Ht 5\' 8"  (1.727 m)   Wt 89.8 kg (198 lb)   SpO2 97%   BMI 30.11 kg/m     Physical Exam     General Appearance:  Alert, Cooperative, Well groomed, NAD.      Integumentary:  skin warm and dry without obvious lesions or rashes.    HEENT:    Head:  Atraumatic  Eye:   R  Eye: lateral aspect of upper lid with 3 mm nodule. No erythema or TTP.  Conjunctiva clear  Cornea clear.          ASSESSMENT/PLAN:    Dennis Morales was seen today for stye.    Diagnoses and all orders for this visit:    Chalazion of right upper eyelid        Patient Instructions   Warm moist soaks 5 minutes 4 times a day    Pt understands and agrees with this plan of care.

## 2021-08-14 NOTE — Patient Instructions (Signed)
Warm moist soaks 5 minutes 4 times a day    Pt understands and agrees with this plan of care.

## 2021-10-14 ENCOUNTER — Encounter (INDEPENDENT_AMBULATORY_CARE_PROVIDER_SITE_OTHER): Payer: Self-pay | Admitting: Sports Medicine

## 2021-10-16 NOTE — Telephone Encounter (Signed)
Lvm for pt to c/b and schedule a video visit to get a PT referral.    FYI

## 2021-11-25 ENCOUNTER — Encounter (INDEPENDENT_AMBULATORY_CARE_PROVIDER_SITE_OTHER): Payer: Self-pay | Admitting: Sports Medicine

## 2022-03-25 ENCOUNTER — Encounter: Payer: Self-pay | Admitting: Hospital

## 2022-08-22 IMAGING — MR MRI SHOULDER LT W CONTRAST
7 of 9 series · 32 of 40 positions shown · non-contrast
Comparison: None

MRI arthrogram of the LEFT shoulder
INDICATION: Anterior dislocation of left shoulder joint .
TECHNIQUE: Multiplanar, multiecho imaging of the left shoulder was performed, including T1-weighted and fluid sensitive sequences. Images were obtained following the intra-articular instillation of dilute contrast material via direct shoulder fluoroscopic-guided injection. (See separate dictation for details regarding injection portion of the study)

[Series 5: t2_axial_fs · axial · left · 3.0mm · 0.50mm/px · z∈[-36,+47]mm · 5 of 22 slices shown]
[im 1/22]
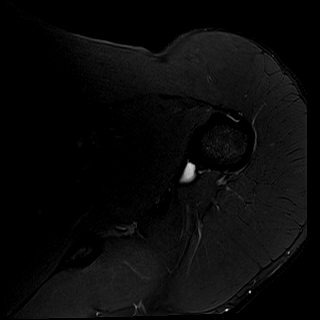
[im 6/22]
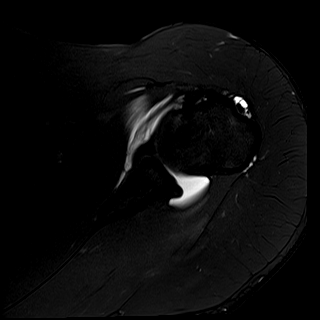
[im 11/22]
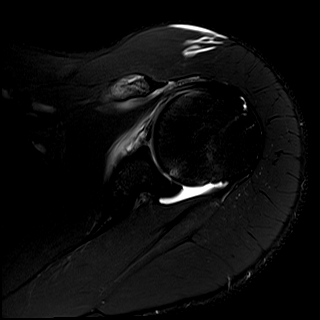
[im 16/22]
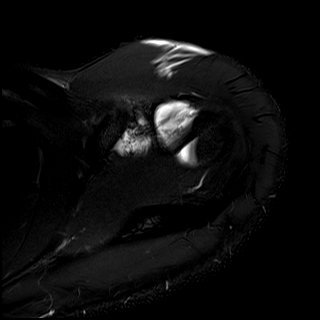
[im 22/22]
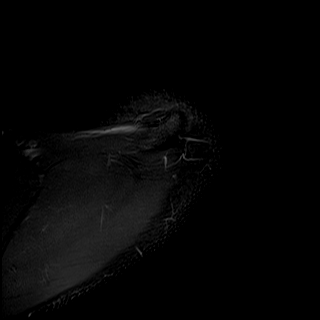

[Series 6: t1_axial_fs · axial · left · 3.0mm · 0.50mm/px · z∈[-36,+47]mm · 4 of 22 slices shown]
[im 1/22]
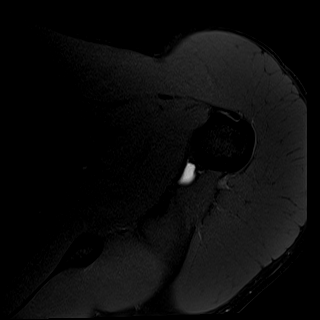
[im 8/22]
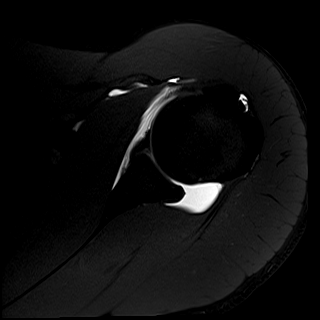
[im 15/22]
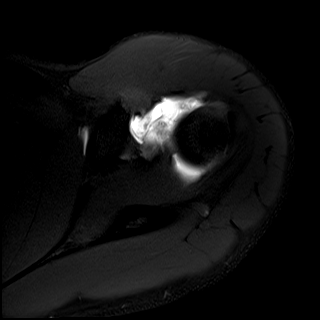
[im 22/22]
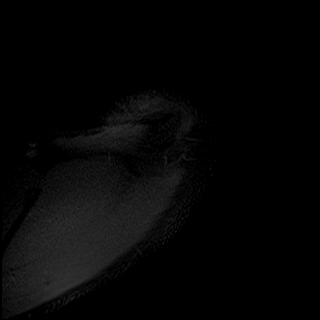

[Series 7: t2_cor_fs · oblique · left · 3.0mm · 0.44mm/px · 4 of 21 slices shown]
[im 1/21]
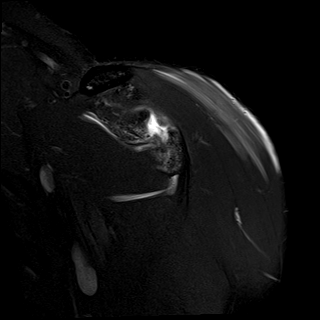
[im 7/21]
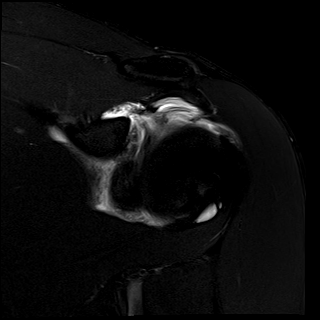
[im 14/21]
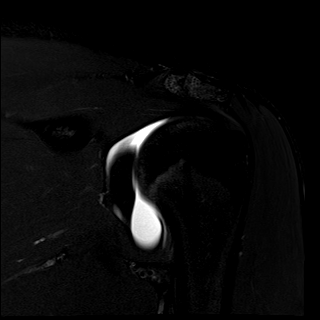
[im 21/21]
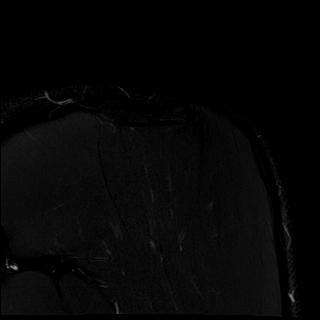

[Series 8: t1_cor_fs · oblique · left · 3.0mm · 0.44mm/px · 4 of 21 slices shown]
[im 1/21]
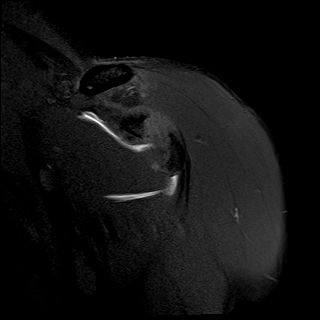
[im 7/21]
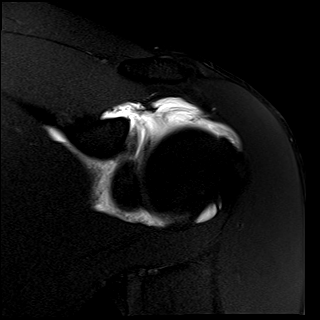
[im 14/21]
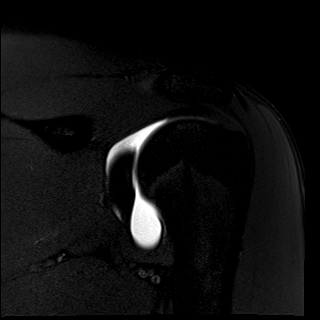
[im 21/21]
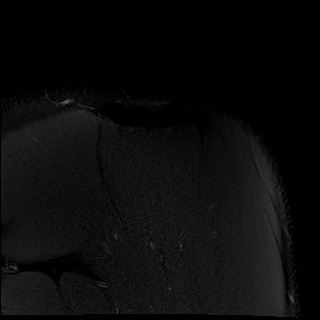

[Series 9: t2_sag_fs · sagittal · left · 3.0mm · 0.44mm/px · 5 of 28 slices shown]
[im 1/28]
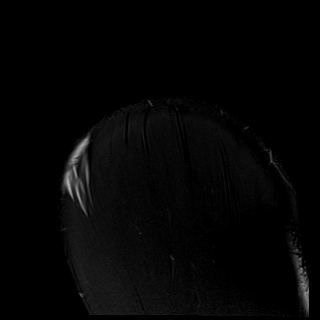
[im 7/28]
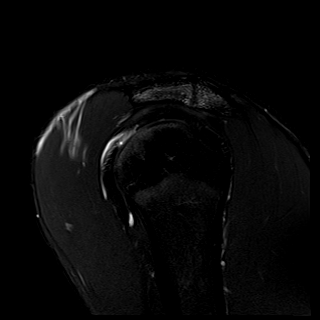
[im 14/28]
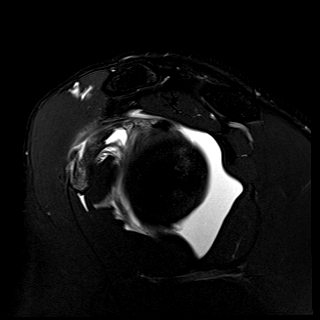
[im 21/28]
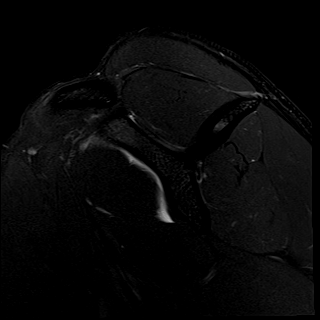
[im 28/28]
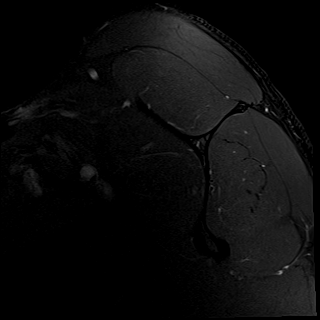

[Series 10: t1_sag · sagittal · left · 3.0mm · 0.36mm/px · 5 of 28 slices shown]
[im 1/28]
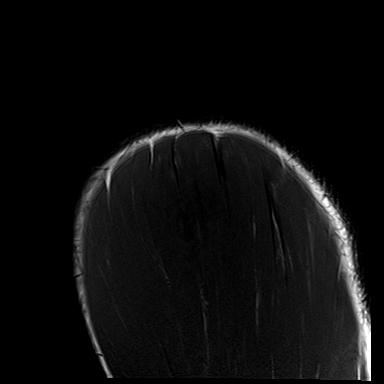
[im 7/28]
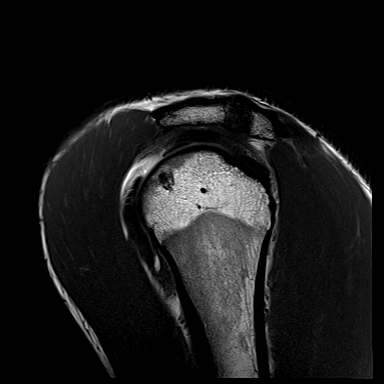
[im 14/28]
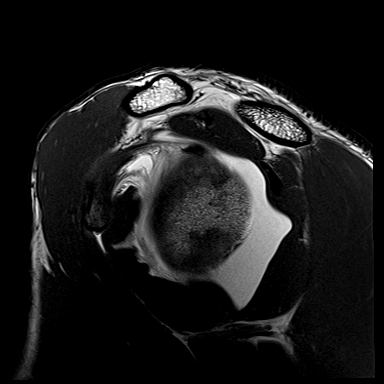
[im 21/28]
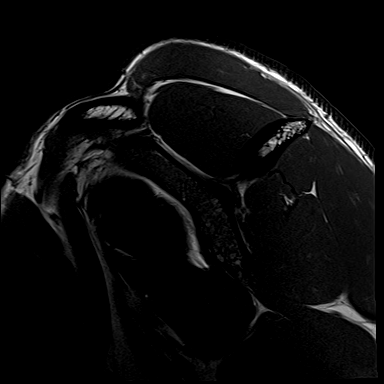
[im 28/28]
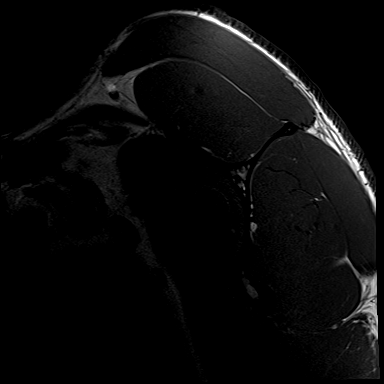

[Series 12: t1_fs_aber · sagittal · left · 3.0mm · 0.55mm/px · 5 of 27 slices shown]
[im 1/27]
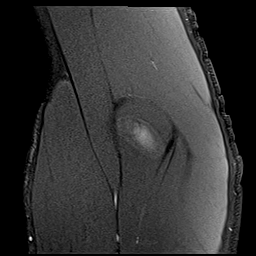
[im 7/27]
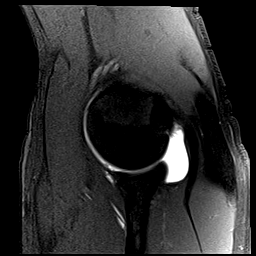
[im 14/27]
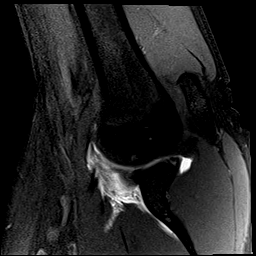
[im 20/27]
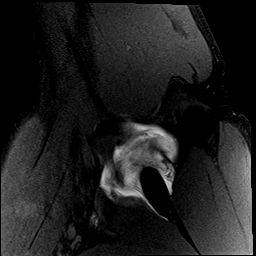
[im 27/27]
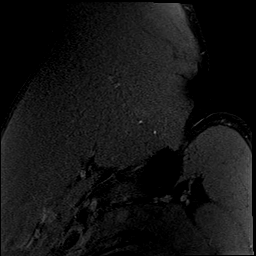

[32 of 40 positions shown; findings below may reference images not displayed]

FINDINGS: Rotator cuff:

Supraspinatus and infraspinatus: Intact.

Subscapularis: Intact.

Teres minor: Intact. 

Cuff muscles: Normal in size and signal intensity.  No fatty infiltration.

Acromioclavicular joint: Normal.

NIKOLA SLOVIC bursa: No bursitis.

Long head biceps tendon: Intact, with a normal course.

Rotator Interval: No scar or obliteration of fat.

Labrum: Large tear involving the anterior and anterior inferior labrum (soft tissue Levon) with questionable periosteal avulsion fracture.

Cartilage: There is a 5 x 5 mm full-thickness chondral defect at the medial humeral head with subchondral marrow edema, likely related to acute impaction.

Marrow: Focal subchondral marrow edema/contusion at the medial humeral head, associated with a chondral defect. Small subcortical contusion of the posterior humeral head (Hill-Sachs impaction lesion). Question small periosteal avulsion fracture at the anterior inferior glenoid associated with labral tear. Mildly displaced acute fracture at the tip of the coronoid process, likely related to recent dislocation. Unfused os acromiale, likely related to age.

No obvious loose body in the glenohumeral joint. No fluid collection. No soft tissue mass.
IMPRESSION: 1.
Recent left anterior glenohumeral joint dislocation with large tear of the anterior and anterior inferior labrum (soft tissue Levon) with questionable associated periosteal avulsion fracture.

2.
Mildly displaced acute fracture at the tip of the coronoid process, likely related to the recent dislocation.

3.
Small 5 x 5 mm full-thickness chondral defect at the medial humeral head, associated with subchondral marrow edema, likely acute impaction.

4.
Small subcortical contusion at the posterior humeral head (Hill-Sachs impaction lesion).

5.
Intact rotator cuff tendons.
